# Patient Record
Sex: Female | Born: 1960 | Race: White | Hispanic: No | Marital: Married | Smoking: Never smoker
Health system: Southern US, Community
[De-identification: ages and names within clinical notes are randomized; demographics above are authoritative.]

## PROBLEM LIST (undated history)

## (undated) DIAGNOSIS — N201 Calculus of ureter: Secondary | ICD-10-CM

## (undated) DIAGNOSIS — I420 Dilated cardiomyopathy: Secondary | ICD-10-CM

## (undated) DIAGNOSIS — B009 Herpesviral infection, unspecified: Secondary | ICD-10-CM

## (undated) DIAGNOSIS — G43909 Migraine, unspecified, not intractable, without status migrainosus: Secondary | ICD-10-CM

## (undated) DIAGNOSIS — I509 Heart failure, unspecified: Secondary | ICD-10-CM

## (undated) DIAGNOSIS — T8859XA Other complications of anesthesia, initial encounter: Secondary | ICD-10-CM

## (undated) DIAGNOSIS — Z9889 Other specified postprocedural states: Secondary | ICD-10-CM

## (undated) DIAGNOSIS — T4145XA Adverse effect of unspecified anesthetic, initial encounter: Secondary | ICD-10-CM

## (undated) DIAGNOSIS — F32A Depression, unspecified: Secondary | ICD-10-CM

## (undated) DIAGNOSIS — R112 Nausea with vomiting, unspecified: Secondary | ICD-10-CM

## (undated) DIAGNOSIS — K219 Gastro-esophageal reflux disease without esophagitis: Secondary | ICD-10-CM

## (undated) DIAGNOSIS — F329 Major depressive disorder, single episode, unspecified: Secondary | ICD-10-CM

## (undated) HISTORY — DX: Migraine, unspecified, not intractable, without status migrainosus: G43.909

## (undated) HISTORY — DX: Heart failure, unspecified: I50.9

## (undated) HISTORY — DX: Major depressive disorder, single episode, unspecified: F32.9

## (undated) HISTORY — PX: ECTOPIC PREGNANCY SURGERY: SHX613

## (undated) HISTORY — PX: DILATION AND CURETTAGE OF UTERUS: SHX78

## (undated) HISTORY — PX: CARDIAC CATHETERIZATION: SHX172

## (undated) HISTORY — DX: Herpesviral infection, unspecified: B00.9

## (undated) HISTORY — PX: TUBAL LIGATION: SHX77

## (undated) HISTORY — PX: OTHER SURGICAL HISTORY: SHX169

## (undated) HISTORY — DX: Depression, unspecified: F32.A

## (undated) HISTORY — DX: Dilated cardiomyopathy: I42.0

## (undated) HISTORY — DX: Gastro-esophageal reflux disease without esophagitis: K21.9

---

## 1998-06-18 ENCOUNTER — Inpatient Hospital Stay (HOSPITAL_COMMUNITY): Admission: AD | Admit: 1998-06-18 | Discharge: 1998-06-21 | Payer: Self-pay | Admitting: *Deleted

## 1998-08-26 ENCOUNTER — Other Ambulatory Visit: Admission: RE | Admit: 1998-08-26 | Discharge: 1998-08-26 | Payer: Self-pay | Admitting: Obstetrics and Gynecology

## 2002-04-16 ENCOUNTER — Encounter: Admission: RE | Admit: 2002-04-16 | Discharge: 2002-04-16 | Payer: Self-pay | Admitting: Obstetrics and Gynecology

## 2002-04-16 ENCOUNTER — Encounter: Payer: Self-pay | Admitting: Obstetrics and Gynecology

## 2002-08-17 ENCOUNTER — Other Ambulatory Visit: Admission: RE | Admit: 2002-08-17 | Discharge: 2002-08-17 | Payer: Self-pay | Admitting: Obstetrics and Gynecology

## 2004-02-29 ENCOUNTER — Other Ambulatory Visit: Admission: RE | Admit: 2004-02-29 | Discharge: 2004-02-29 | Payer: Self-pay | Admitting: Obstetrics and Gynecology

## 2004-04-27 ENCOUNTER — Encounter (INDEPENDENT_AMBULATORY_CARE_PROVIDER_SITE_OTHER): Payer: Self-pay | Admitting: *Deleted

## 2004-04-27 ENCOUNTER — Ambulatory Visit (HOSPITAL_COMMUNITY): Admission: RE | Admit: 2004-04-27 | Discharge: 2004-04-27 | Payer: Self-pay | Admitting: Obstetrics and Gynecology

## 2008-02-24 ENCOUNTER — Encounter: Admission: RE | Admit: 2008-02-24 | Discharge: 2008-02-24 | Payer: Self-pay | Admitting: Obstetrics and Gynecology

## 2009-11-20 ENCOUNTER — Emergency Department (HOSPITAL_COMMUNITY): Admission: EM | Admit: 2009-11-20 | Discharge: 2009-11-20 | Payer: Self-pay | Admitting: Family Medicine

## 2009-11-24 ENCOUNTER — Encounter (INDEPENDENT_AMBULATORY_CARE_PROVIDER_SITE_OTHER): Payer: Self-pay | Admitting: Obstetrics and Gynecology

## 2009-11-24 ENCOUNTER — Ambulatory Visit: Admission: RE | Admit: 2009-11-24 | Discharge: 2009-11-24 | Payer: Self-pay | Admitting: Obstetrics and Gynecology

## 2009-11-25 ENCOUNTER — Inpatient Hospital Stay (HOSPITAL_COMMUNITY): Admission: EM | Admit: 2009-11-25 | Discharge: 2009-11-28 | Payer: Self-pay | Admitting: Emergency Medicine

## 2009-12-19 ENCOUNTER — Inpatient Hospital Stay (HOSPITAL_BASED_OUTPATIENT_CLINIC_OR_DEPARTMENT_OTHER): Admission: RE | Admit: 2009-12-19 | Discharge: 2009-12-19 | Payer: Self-pay | Admitting: Cardiology

## 2009-12-23 ENCOUNTER — Encounter: Payer: Self-pay | Admitting: Cardiology

## 2010-10-29 ENCOUNTER — Encounter: Payer: Self-pay | Admitting: Gastroenterology

## 2010-12-28 LAB — POCT URINALYSIS DIP (DEVICE)
Glucose, UA: NEGATIVE mg/dL
pH: 5 (ref 5.0–8.0)

## 2010-12-28 LAB — PROTEIN ELECTROPHORESIS, SERUM
Alpha-1-Globulin: 5.6 % — ABNORMAL HIGH (ref 2.9–4.9)
Alpha-2-Globulin: 14.7 % — ABNORMAL HIGH (ref 7.1–11.8)
Beta Globulin: 8.3 % — ABNORMAL HIGH (ref 4.7–7.2)
Gamma Globulin: 9.1 % — ABNORMAL LOW (ref 11.1–18.8)
M-Spike, %: NOT DETECTED g/dL

## 2010-12-28 LAB — PROTEIN ELECTROPH W RFLX QUANT IMMUNOGLOBULINS
Alpha-1-Globulin: 5.8 % — ABNORMAL HIGH (ref 2.9–4.9)
M-Spike, %: NOT DETECTED g/dL
Total Protein ELP: 5.8 g/dL — ABNORMAL LOW (ref 6.0–8.3)

## 2010-12-28 LAB — CARDIAC PANEL(CRET KIN+CKTOT+MB+TROPI)
CK, MB: 2.4 ng/mL (ref 0.3–4.0)
Relative Index: 1.6 (ref 0.0–2.5)
Total CK: 151 U/L (ref 7–177)
Troponin I: 0.04 ng/mL (ref 0.00–0.06)
Troponin I: 0.04 ng/mL (ref 0.00–0.06)

## 2010-12-28 LAB — BASIC METABOLIC PANEL
BUN: 12 mg/dL (ref 6–23)
BUN: 13 mg/dL (ref 6–23)
CO2: 30 mEq/L (ref 19–32)
Calcium: 8.4 mg/dL (ref 8.4–10.5)
Calcium: 8.7 mg/dL (ref 8.4–10.5)
Chloride: 102 mEq/L (ref 96–112)
Chloride: 103 mEq/L (ref 96–112)
Creatinine, Ser: 1.04 mg/dL (ref 0.4–1.2)
GFR calc Af Amer: >60 mL/min (ref >60)
GFR calc non Af Amer: 56 mL/min — ABNORMAL LOW (ref >60)
GFR calc non Af Amer: 57 mL/min — ABNORMAL LOW (ref >60)
GFR calc non Af Amer: 58 mL/min — ABNORMAL LOW (ref >60)
Glucose, Bld: 112 mg/dL — ABNORMAL HIGH (ref 70–99)
Glucose, Bld: 120 mg/dL — ABNORMAL HIGH (ref 70–99)
Potassium: 3.4 mEq/L — ABNORMAL LOW (ref 3.5–5.1)
Potassium: 4.1 mEq/L (ref 3.5–5.1)
Potassium: 4.3 mEq/L (ref 3.5–5.1)
Sodium: 136 mEq/L (ref 135–145)
Sodium: 140 mEq/L (ref 135–145)

## 2010-12-28 LAB — PROTIME-INR
INR: 1.25 (ref 0.00–1.49)
INR: 1.28 (ref 0.00–1.49)
Prothrombin Time: 15.6 seconds — ABNORMAL HIGH (ref 11.6–15.2)

## 2010-12-28 LAB — COMPREHENSIVE METABOLIC PANEL
ALT: 196 U/L — ABNORMAL HIGH (ref 0–35)
AST: 169 U/L — ABNORMAL HIGH (ref 0–37)
Albumin: 3.2 g/dL — ABNORMAL LOW (ref 3.5–5.2)
Alkaline Phosphatase: 170 U/L — ABNORMAL HIGH (ref 39–117)
BUN: 13 mg/dL (ref 6–23)
CO2: 24 mEq/L (ref 19–32)
Calcium: 8.7 mg/dL (ref 8.4–10.5)
Chloride: 106 mEq/L (ref 96–112)
Creatinine, Ser: 0.83 mg/dL (ref 0.4–1.2)
GFR calc Af Amer: >60 mL/min (ref >60)
GFR calc non Af Amer: >60 mL/min (ref >60)
Glucose, Bld: 105 mg/dL — ABNORMAL HIGH (ref 70–99)
Potassium: 4.3 mEq/L (ref 3.5–5.1)
Sodium: 137 mEq/L (ref 135–145)
Total Bilirubin: 0.9 mg/dL (ref 0.3–1.2)
Total Protein: 6.2 g/dL (ref 6.0–8.3)

## 2010-12-28 LAB — CBC
HCT: 34.4 % — ABNORMAL LOW (ref 36.0–46.0)
Hemoglobin: 11.9 g/dL — ABNORMAL LOW (ref 12.0–15.0)
MCHC: 34.5 g/dL (ref 30.0–36.0)
MCV: 88 fL (ref 78.0–100.0)
Platelets: 238 10*3/uL (ref 150–400)
Platelets: 258 10*3/uL (ref 150–400)
RBC: 3.91 MIL/uL (ref 3.87–5.11)
RDW: 13 % (ref 11.5–15.5)
RDW: 13 % (ref 11.5–15.5)
WBC: 12.5 10*3/uL — ABNORMAL HIGH (ref 4.0–10.5)

## 2010-12-28 LAB — BRAIN NATRIURETIC PEPTIDE
Pro B Natriuretic peptide (BNP): 1227 pg/mL — ABNORMAL HIGH (ref 0.0–100.0)
Pro B Natriuretic peptide (BNP): 461 pg/mL — ABNORMAL HIGH (ref 0.0–100.0)

## 2010-12-28 LAB — IRON AND TIBC: Saturation Ratios: 4 % — ABNORMAL LOW (ref 20–55)

## 2010-12-28 LAB — TROPONIN I: Troponin I: 0.03 ng/mL (ref 0.00–0.06)

## 2010-12-28 LAB — HIV ANTIBODY (ROUTINE TESTING W REFLEX): HIV: NONREACTIVE

## 2010-12-28 LAB — CK TOTAL AND CKMB (NOT AT ARMC)
CK, MB: 2.9 ng/mL (ref 0.3–4.0)
Relative Index: 1.6 (ref 0.0–2.5)

## 2010-12-28 LAB — APTT: aPTT: 29 seconds (ref 24–37)

## 2010-12-28 LAB — TSH: TSH: 1.301 u[IU]/mL (ref 0.350–4.500)

## 2011-02-13 ENCOUNTER — Emergency Department (HOSPITAL_COMMUNITY): Payer: BC Managed Care – PPO

## 2011-02-13 ENCOUNTER — Emergency Department (HOSPITAL_COMMUNITY)
Admission: EM | Admit: 2011-02-13 | Discharge: 2011-02-13 | Disposition: A | Discharge status: Home / Self Care | Payer: BC Managed Care – PPO | Attending: Emergency Medicine | Admitting: Emergency Medicine

## 2011-02-13 DIAGNOSIS — K219 Gastro-esophageal reflux disease without esophagitis: Secondary | ICD-10-CM | POA: Insufficient documentation

## 2011-02-13 DIAGNOSIS — R0602 Shortness of breath: Secondary | ICD-10-CM | POA: Insufficient documentation

## 2011-02-13 DIAGNOSIS — R209 Unspecified disturbances of skin sensation: Secondary | ICD-10-CM | POA: Insufficient documentation

## 2011-02-13 DIAGNOSIS — K5900 Constipation, unspecified: Secondary | ICD-10-CM | POA: Insufficient documentation

## 2011-02-13 DIAGNOSIS — R079 Chest pain, unspecified: Secondary | ICD-10-CM | POA: Insufficient documentation

## 2011-02-13 DIAGNOSIS — R0989 Other specified symptoms and signs involving the circulatory and respiratory systems: Secondary | ICD-10-CM | POA: Insufficient documentation

## 2011-02-13 DIAGNOSIS — R0609 Other forms of dyspnea: Secondary | ICD-10-CM | POA: Insufficient documentation

## 2011-02-13 DIAGNOSIS — I509 Heart failure, unspecified: Secondary | ICD-10-CM | POA: Insufficient documentation

## 2011-02-13 DIAGNOSIS — Z79899 Other long term (current) drug therapy: Secondary | ICD-10-CM | POA: Insufficient documentation

## 2011-02-13 LAB — DIFFERENTIAL
Basophils Absolute: 0 10*3/uL (ref 0.0–0.1)
Basophils Relative: 0 % (ref 0–1)
Lymphocytes Relative: 26 % (ref 12–46)
Monocytes Absolute: 0.6 10*3/uL (ref 0.1–1.0)
Monocytes Relative: 7 % (ref 3–12)
Neutro Abs: 5.2 10*3/uL (ref 1.7–7.7)
Neutrophils Relative %: 64 % (ref 43–77)

## 2011-02-13 LAB — POCT CARDIAC MARKERS
Myoglobin, poc: 57.7 ng/mL (ref 12–200)
Troponin i, poc: <0.05 ng/mL (ref 0.00–0.09)
Troponin i, poc: <0.05 ng/mL (ref 0.00–0.09)

## 2011-02-13 LAB — CBC
HCT: 38.4 % (ref 36.0–46.0)
Hemoglobin: 13.2 g/dL (ref 12.0–15.0)
RBC: 4.34 MIL/uL (ref 3.87–5.11)

## 2011-02-13 LAB — D-DIMER, QUANTITATIVE: D-Dimer, Quant: <0.22 ug/mL-FEU (ref 0.00–0.48)

## 2011-02-13 LAB — BASIC METABOLIC PANEL
Calcium: 10 mg/dL (ref 8.4–10.5)
Creatinine, Ser: 0.84 mg/dL (ref 0.4–1.2)
GFR calc Af Amer: >60 mL/min (ref >60)
GFR calc non Af Amer: >60 mL/min (ref >60)
Sodium: 138 mEq/L (ref 135–145)

## 2011-02-13 LAB — PRO B NATRIURETIC PEPTIDE: Pro B Natriuretic peptide (BNP): 55.6 pg/mL (ref 0–125)

## 2011-02-23 NOTE — Op Note (Signed)
NAME:  Krystal Bradley, Krystal Bradley                         ACCOUNT NO.:  [id number]   MEDICAL RECORD NO.:  [id number]                   PATIENT TYPE:  AMB   LOCATION:  SDC                                  FACILITY:  [facility]   PHYSICIAN:  Maxie Better, M.D.            DATE OF BIRTH:  1961/04/06   DATE OF PROCEDURE:  04/27/2004  DATE OF DISCHARGE:                                 OPERATIVE REPORT   PREOPERATIVE DIAGNOSES:  Menorrhagia, endometrial mass.   POSTOPERATIVE DIAGNOSES:  Menorrhagia, endometrial mass.   PROCEDURE:  Diagnostic hysteroscopy, dilation and curettage.   ANESTHESIA:  General with a paracervical block.   SURGEON:  Maxie Better, M.D.   DESCRIPTION OF PROCEDURE:  Under adequate general anesthesia, the patient  was placed in the dorsal lithotomy position, she was sterilely prepped and  draped in the usual fashion.  A Laminaria was removed from the cervix, the  vagina was prepped with Betadine solution. The bladder was catheterized of a  small amount of urine. Examination under anesthesia revealed an anteverted  uterus, no adnexal masses could be appreciated. A bivalve speculum was  placed in the vagina, single tooth tenaculum was placed on the anterior lip  of the cervix. The cervix is easily dilated up to a #31 Pratt dilator. A  resectoscope was then introduced into the cavity, the left tubal ostia was  obscured by the polypoid-like tissue in that left cornual area. Additional  irregularity of the anterior and the posterior endometrial cavity was noted.  No discreet defined fibroid was initially seen.  The polypoid lesions are  resected, the right tubal ostia was then subsequently seen. The resectoscope  was removed, the cavity was then curetted, the resectoscope was then  reinserted and additional thickening areas were then subsequently removed.  The endocervical canal was without any lesion, there was no visible or  palpable __________ curettage of any submucosal  fibroid. It was then felt  that the procedure was complete and therefore the resectoscope was removed.  20 mL of 1% Nesacaine was injected paracervically as the patient is allergic  to IBUPROFEN and therefore was not a candidate for Toradol. All instruments  were then removed from the vagina, specimens labeled endometrial curetting  with question endometrial polyps was sent to pathology.  Estimated blood  loss was minimal. Complications none.  The patient tolerated the procedure  well and was transferred to the recovery room in stable condition.                                               Maxie Better, M.D.    [place]uldin/MEDQ  D:  04/27/2004  T:  04/28/2004  Job:  [id number]

## 2011-03-12 IMAGING — CR DG CHEST 2V
2 series · 2 of 2 positions shown · non-contrast
Comparison: None

CLINICAL DATA: Chest pain.

CHEST - 2 VIEW

[w chest pa]
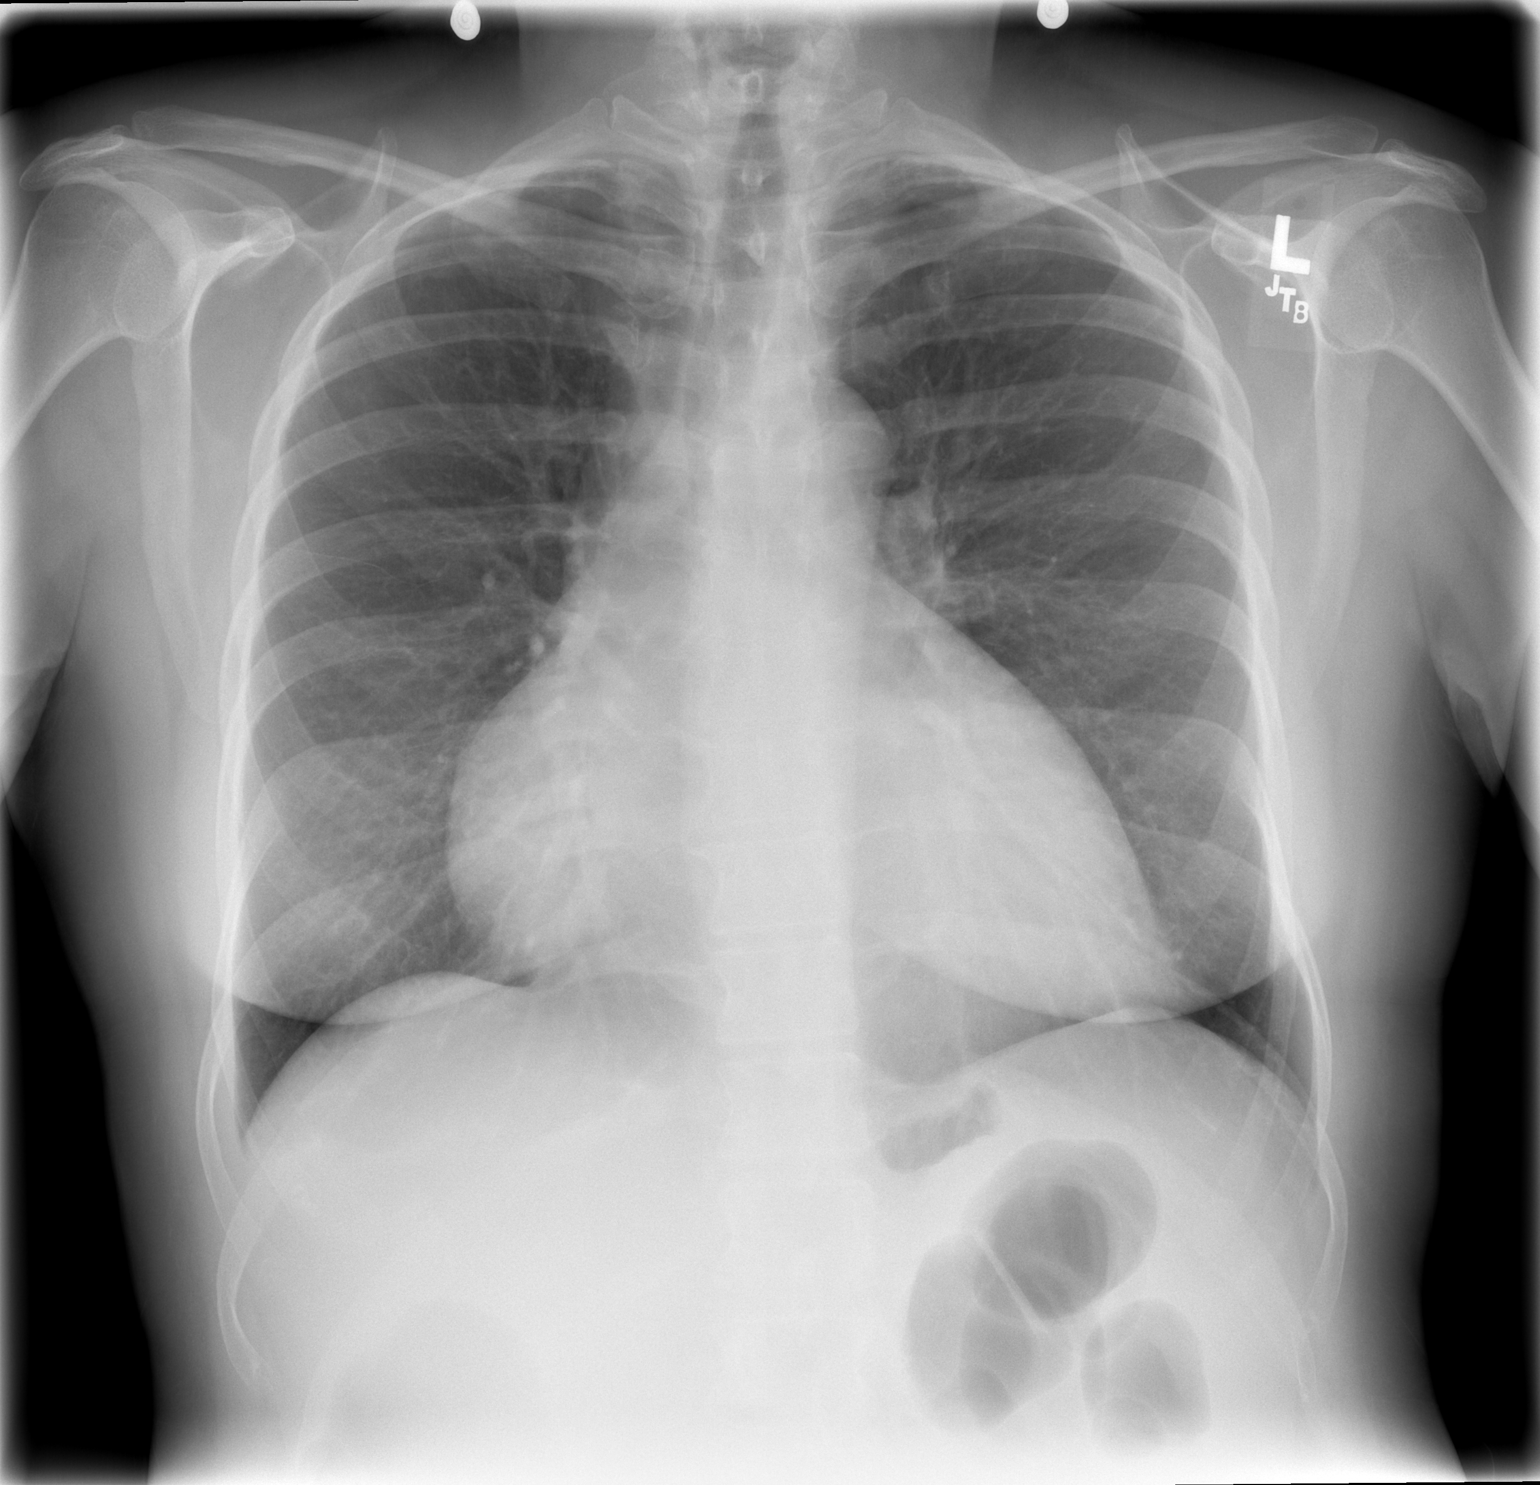

[w chest lat]
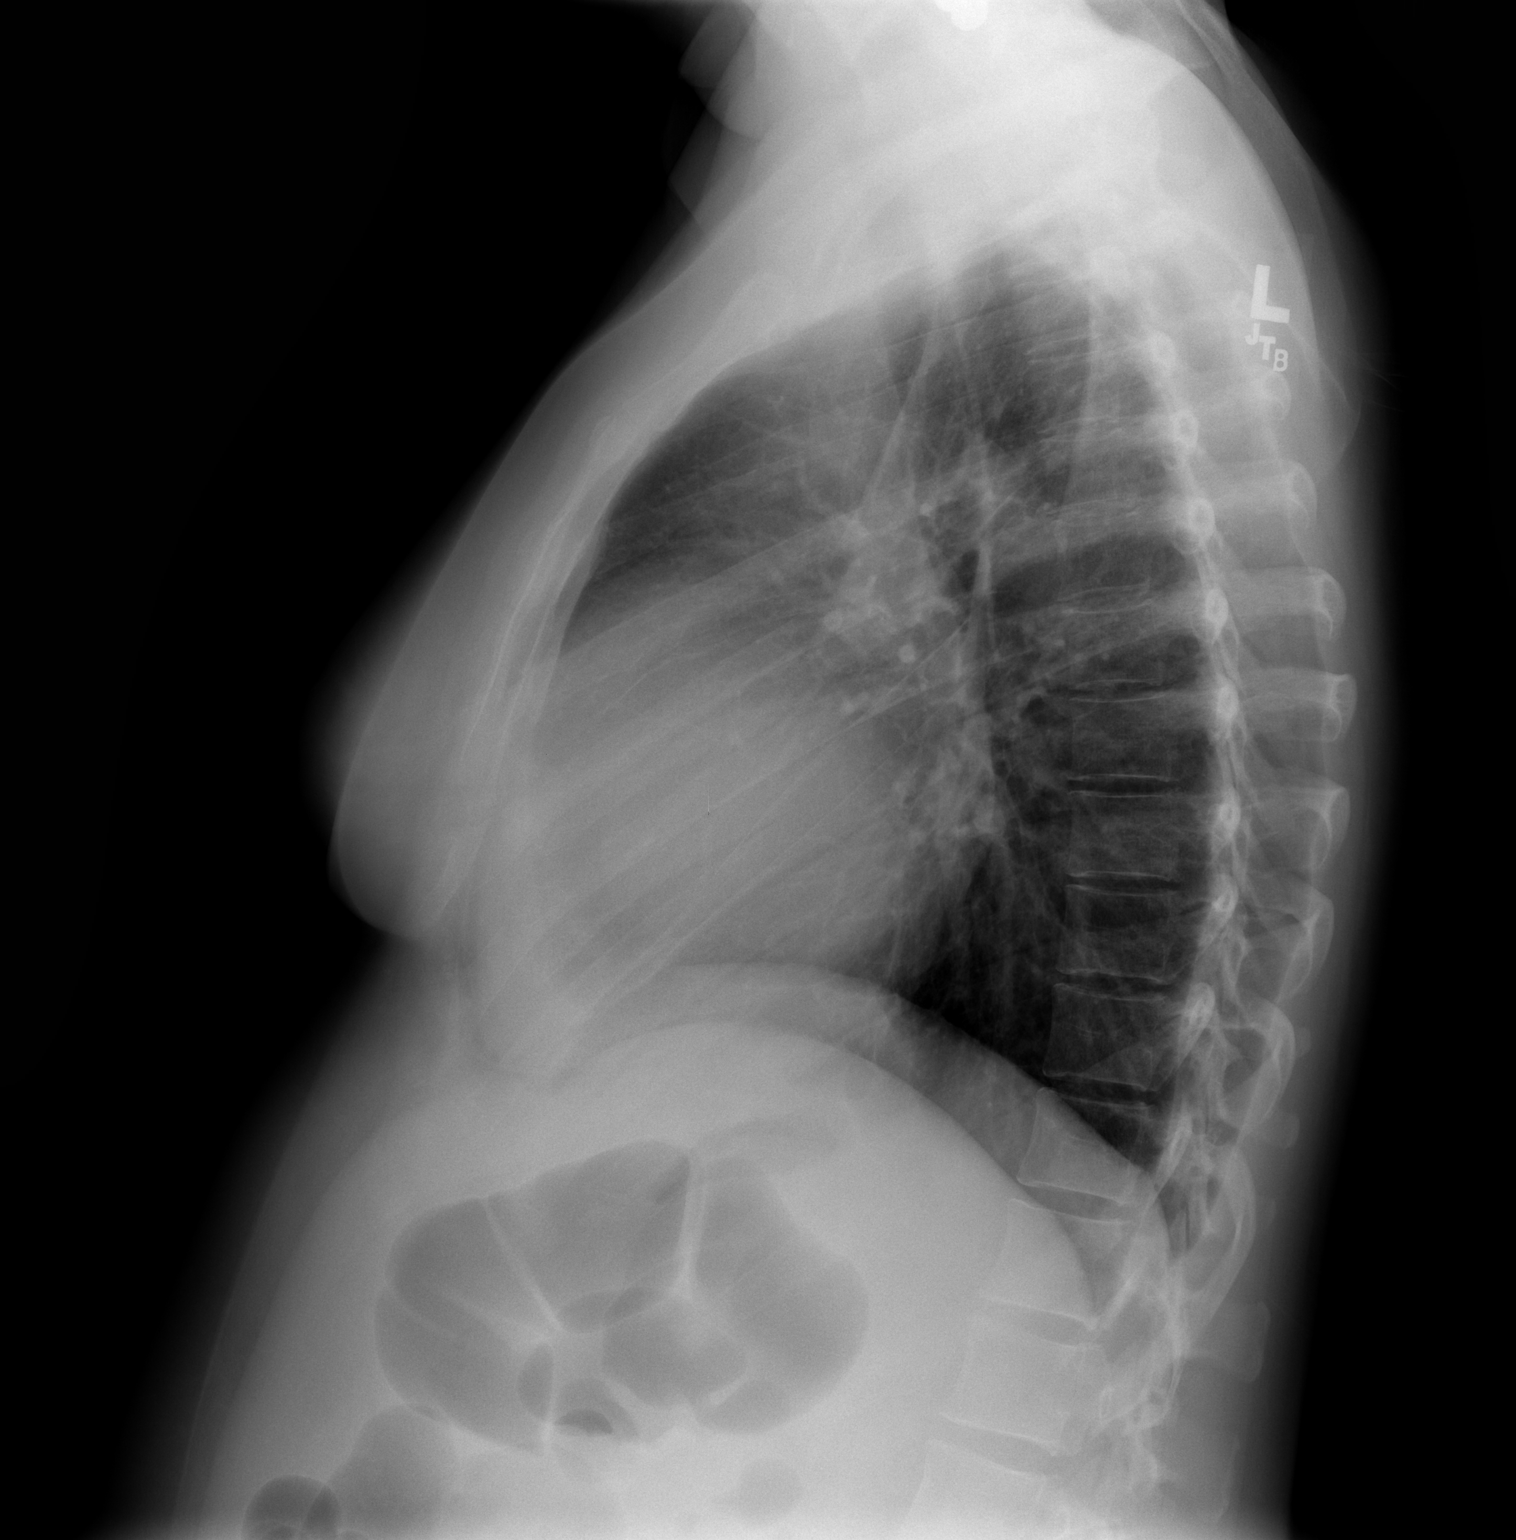

[2 of 2 positions shown; findings below may reference images not displayed]

FINDINGS: The right heart border is abnormal.  Right atrial
enlargement versus possible mediastinal mass superimposed on the
right heart border.  Chest CT suggested for further evaluation.
The lungs are clear.  The bony thorax is intact.
IMPRESSION: Cardiac enlargement versus less likely mediastinal mass.  Recommend
chest CT.

## 2011-03-12 IMAGING — CT CT CHEST W/ CM
4 of 5 series · 13 of 46 positions shown, 18 images · IV contrast (agent unspecified)
Comparison: Plain films chest 11/25/2009 at 11/21/2022

CT CHEST

CLINICAL DATA: Chest and abdominal pain.

CT CHEST, ABDOMEN AND PELVIS WITH CONTRAST
TECHNIQUE: Multidetector CT imaging of the chest, abdomen and
pelvis was performed following the standard protocol during bolus
administration of intravenous contrast.
Contrast: 100 ml [id number]

[Series 2: chest/abd/pelvis · axial · 0.73mm/px · z∈[-560,-165]mm · 7 of 120 slices shown]
[im 14/120  soft-tissue]
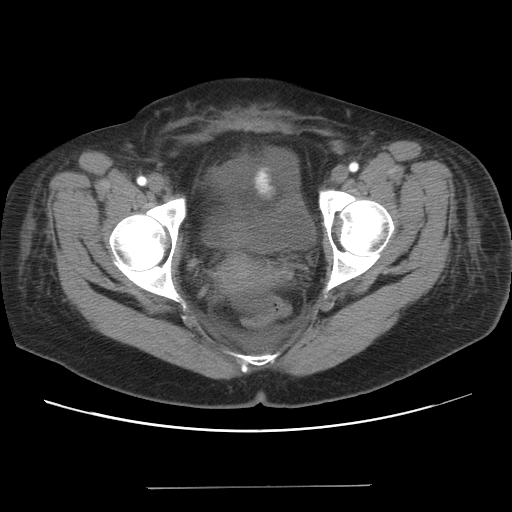
[im 27/120  soft-tissue]
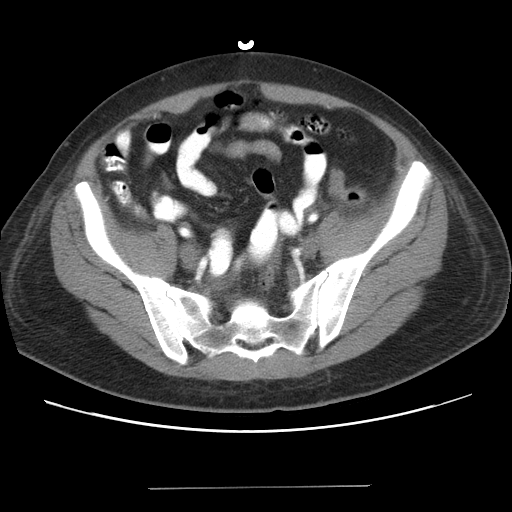
[im 40/120  soft-tissue]
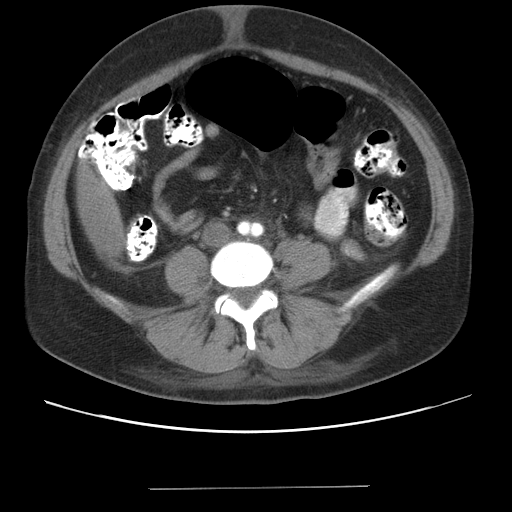
[im 53/120  soft-tissue]
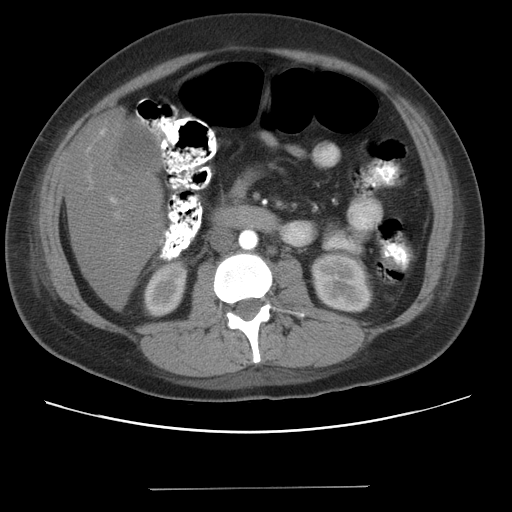
[im 67/120  soft-tissue]
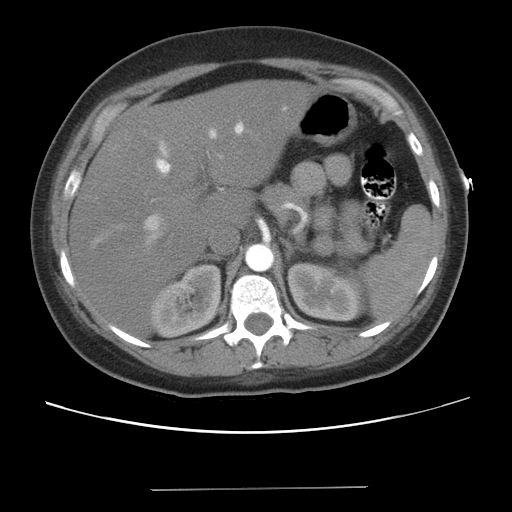
[im 80/120  soft-tissue]
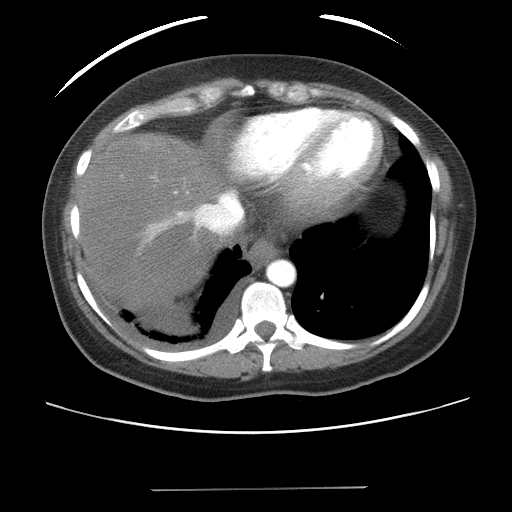
[im 93/120  soft-tissue]
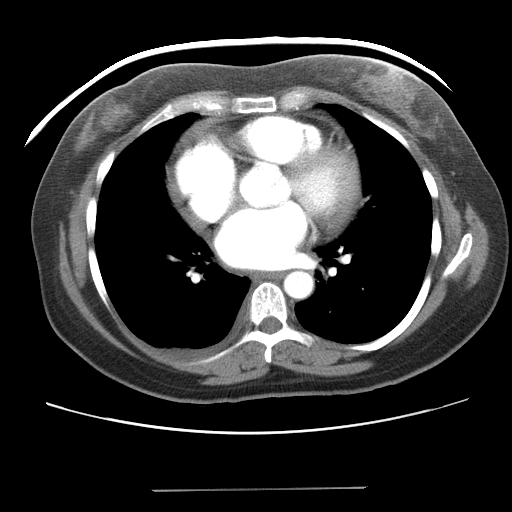

[Series 5: renal delays · axial · 0.70mm/px · z∈[-349,-309]mm · 2 of 25 slices shown, 5 images]
[im 9/25  soft-tissue]
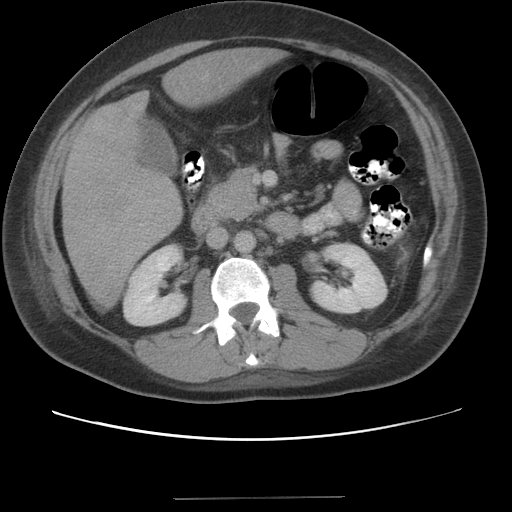
[im 9/25  lung]
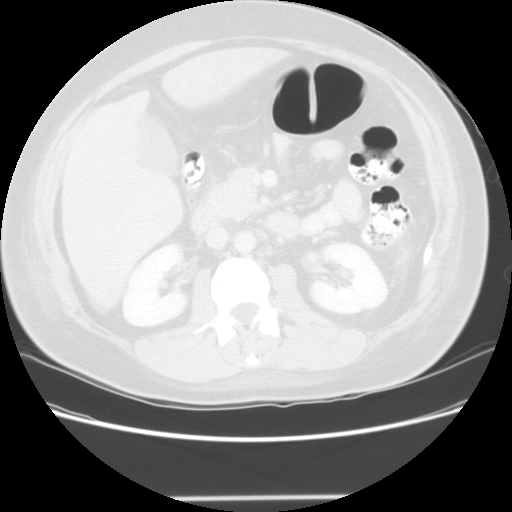
[im 9/25  bone]
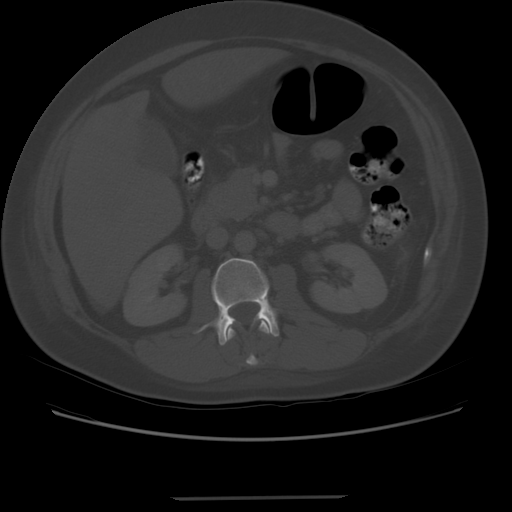
[im 17/25  soft-tissue]
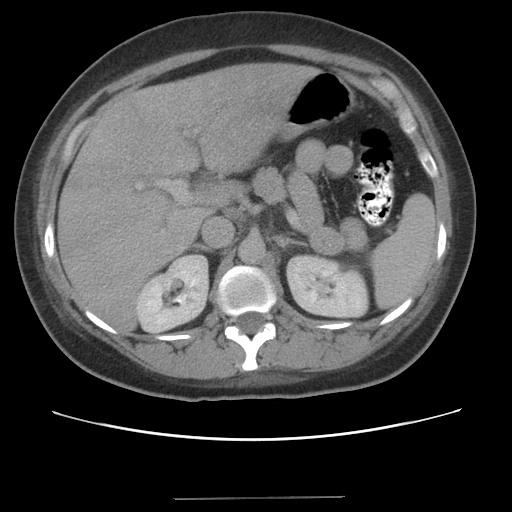
[im 17/25  lung]
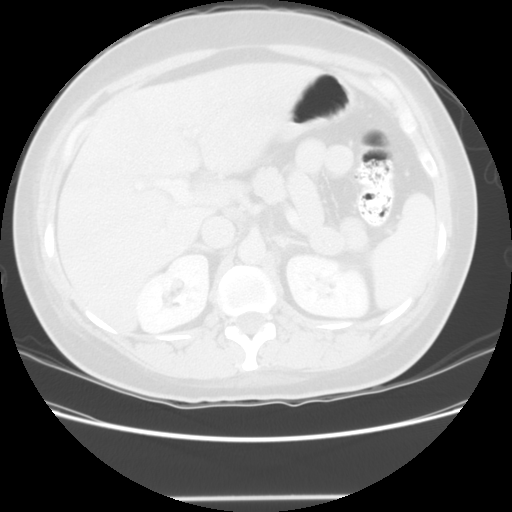

[Series 400: reformatted · sagittal · 1.23mm/px · 1 of 130 slices shown, 2 images (1 of 2)]
[im 44/130  soft-tissue]
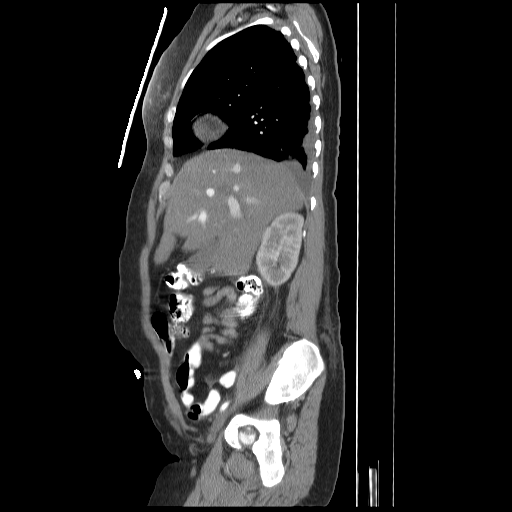
[im 44/130  bone]
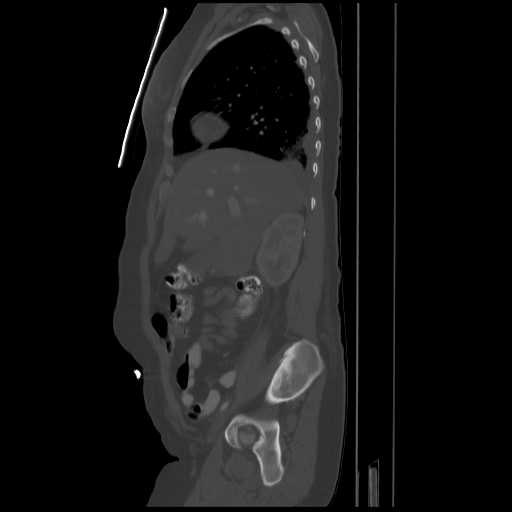

[Series 401: reformatted · coronal · 1.23mm/px · 3 of 109 slices shown, 4 images (2 of 2)]
[im 37/109  soft-tissue]
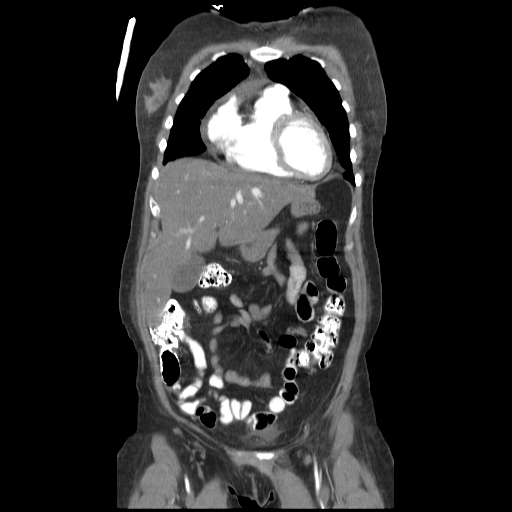
[im 49/109  soft-tissue]
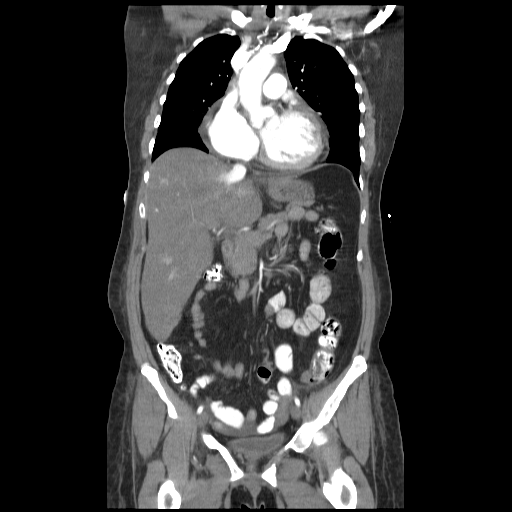
[im 49/109  bone]
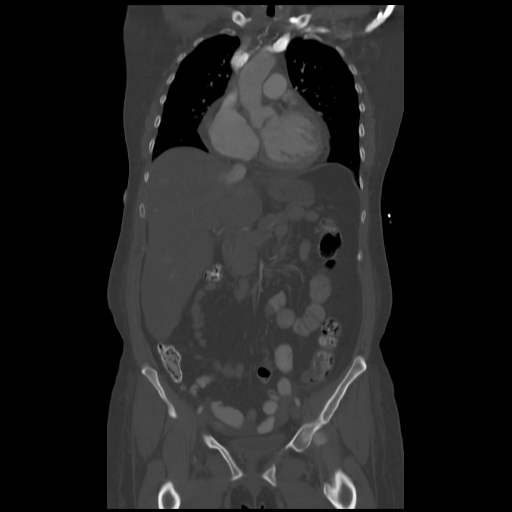
[im 61/109  soft-tissue]
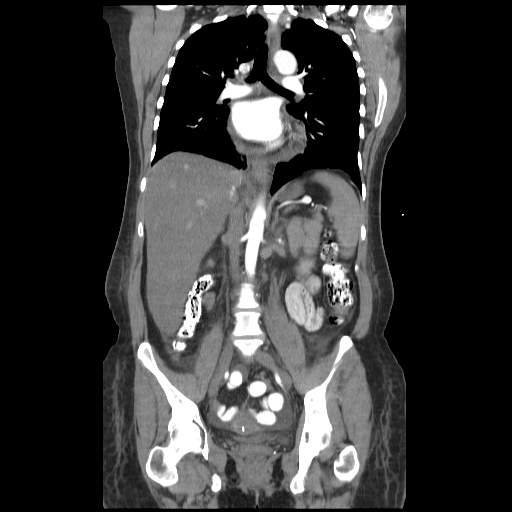

[13 of 46 positions shown; findings below may reference images not displayed]

FINDINGS: There is no axillary, hilar mediastinal lymphadenopathy.
The patient has marked cardiomegaly and a small to moderate
pericardial effusion.  There is also a small right pleural
effusion.  Tiny amount of pleural fluid on the left is noted.
Contrast material refluxes into the inferior vena cava.  No
axillary, hilar or mediastinal lymphadenopathy.  Patchy airspace
disease is noted the right lower lobe.  A subcentimeter pulmonary
nodules seen in the left base on image 37 measuring 0.5 cm.  The
lungs are otherwise clear.  No focal bony abnormality.
IMPRESSION: 1.  Marked cardiomegaly.  Contrast material refluxing into the
inferior vena cava is compatible with right heart insufficiency.
3.  Small to moderate pericardial effusion.
3.  Small right pleural effusion.
4.  Right basilar airspace disease could be due to atelectasis or
pneumonia.
5.  0.5 cm left lower lobe pulmonary nodule. If the patient is at
high risk for bronchogenic carcinoma, follow-up chest CT at 6-12
months is recommended.  If the patient is at low risk for
bronchogenic carcinoma, follow-up chest CT at 12 months is
recommended.  This recommendation follows the consensus statement:
"Guidelines for Management of Small Pulmonary Nodules Detected on
CT Scans: A Statement from the [HOSPITAL]" as published in
[URL]

CT ABDOMEN AND PELVIS
FINDINGS: There is some increased attenuation in the gallbladder
likely due to sludge.  The liver is diffusely low attenuating
consistent with fatty change but there is no focal liver lesion.
No intra or extra hepatic biliary ductal dilatation.  The spleen,
adrenal glands and kidneys appear normal.  The pancreas is
unremarkable.

The stomach and small bowel appear normal.  There is a small volume
of pelvic ascites with os with some fluid also seen in the
paracolic gutters bilaterally.  There is also some subcutaneous
edema diffusely.  No focal bony abnormality.
IMPRESSION: 1.  Findings compatible with mild anasarca.
2.  Fatty infiltration of the liver.
3.  Likely gallbladder sludge.

## 2011-08-14 ENCOUNTER — Encounter (HOSPITAL_COMMUNITY): Payer: Self-pay | Admitting: Internal Medicine

## 2011-08-14 ENCOUNTER — Encounter: Payer: Self-pay | Admitting: *Deleted

## 2011-09-04 ENCOUNTER — Encounter (HOSPITAL_COMMUNITY): Payer: BC Managed Care – PPO

## 2011-09-06 ENCOUNTER — Telehealth (HOSPITAL_COMMUNITY): Payer: Self-pay | Admitting: *Deleted

## 2011-09-10 NOTE — Telephone Encounter (Signed)
Opened in error

## 2011-09-14 ENCOUNTER — Encounter (HOSPITAL_COMMUNITY): Payer: BC Managed Care – PPO

## 2012-01-29 ENCOUNTER — Encounter: Payer: Self-pay | Admitting: *Deleted

## 2012-01-30 ENCOUNTER — Encounter: Payer: Self-pay | Admitting: Cardiology

## 2012-01-30 ENCOUNTER — Ambulatory Visit (INDEPENDENT_AMBULATORY_CARE_PROVIDER_SITE_OTHER): Payer: BC Managed Care – PPO | Admitting: Cardiology

## 2012-01-30 DIAGNOSIS — I509 Heart failure, unspecified: Secondary | ICD-10-CM

## 2012-01-30 DIAGNOSIS — I429 Cardiomyopathy, unspecified: Secondary | ICD-10-CM | POA: Insufficient documentation

## 2012-01-30 DIAGNOSIS — I428 Other cardiomyopathies: Secondary | ICD-10-CM

## 2012-01-30 NOTE — Assessment & Plan Note (Signed)
Euvolemic on examination. Continue present dose of diuretics. Recent potassium and renal function normal.

## 2012-01-30 NOTE — Assessment & Plan Note (Addendum)
Patient with previous cardiomyopathy of uncertain etiology. Cardiac catheterization revealed no coronary disease. No history of alcohol use or hypertension. Presumed viral. She is doing extremely well on medications. Her LV function is improving on her last echocardiogram. Will repeat to reassess. Continue ACE inhibitor and beta blocker.

## 2012-01-30 NOTE — Progress Notes (Signed)
HPI: 51 year-old female for evaluation of cardiomyopathy and congestive heart failure. Echocardiogram in February of 2011 showed an ejection fraction of 20-25%, mild biatrial enlargement, mild mitral regurgitation and moderate tricuspid regurgitation. There is a small pericardial effusion. Cardiac catheterization in 2011 showed an ejection fraction of 15% and normal coronary arteries. Patient was treated medically. She was seen at [facility] for treatment of her heart failure. Apparently a repeat echocardiogram in February of 2012 showed improvement in her LV function with an ejection fraction of 45%. The patient now presents to establish in [place]. She is markedly improved on medications. She denies any dyspnea on exertion, orthopnea, PND, pedal edema, palpitations, syncope or chest pain. She had blood work checked on April 22 that showed normal renal function and a potassium of 4.1.  Current Outpatient Prescriptions  Medication Sig Dispense Refill  . ALPRAZolam (XANAX) 1 MG tablet Take 1 mg by mouth daily.        . butalbital-acetaminophen-caffeine (FIORICET WITH CODEINE) 50-325-40-30 MG per capsule Take 1 capsule by mouth every 4 (four) hours as needed.      . carvedilol (COREG) 6.25 MG tablet Take 6.25 mg by mouth 2 (two) times daily with a meal.        . FLUoxetine (PROZAC) 20 MG capsule Take 20 mg by mouth daily.        . furosemide (LASIX) 40 MG tablet Take 1 tablet by mouth Daily.      . methocarbamol (ROBAXIN) 500 MG tablet Take 500 mg by mouth as needed.      . nortriptyline (PAMELOR) 25 MG capsule Take 25 mg by mouth at bedtime.        . ramipril (ALTACE) 5 MG capsule Take 5 mg by mouth daily.        . ranitidine (ZANTAC) 150 MG tablet Take 150 mg by mouth as needed.        Marland Kitchen spironolactone (ALDACTONE) 25 MG tablet Take 25 mg by mouth daily.        . valACYclovir (VALTREX) 500 MG tablet Take 500 mg by mouth daily.        Marland Kitchen zonisamide (ZONEGRAN) 25 MG capsule Take 50 mg by mouth  daily.          Allergies  Allergen Reactions  . Ibuprofen Hives  . Penicillins Hives  . Cephalosporins Rash    Past Medical History  Diagnosis Date  . 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 Migraines   . Depression   . CHF (congestive heart failure)   . GERD (gastroesophageal reflux disease)   . Herpes     Past Surgical History  Procedure Date  . Cesarean section   . Ectopic pregnancy surgery   . Dilation and curettage of uterus   . Tonsillectomy     History   Social History  . Marital Status: Married    Spouse Name: N/A    Number of Children: 2  . Years of Education: N/A   Occupational History  .  [employer]   Social History Main Topics  . Smoking status: Never Smoker   . Smokeless tobacco: Not on file  . Alcohol Use: 0.0 oz/week     Occasional  . Drug Use: No  . Sexually Active: Not on file   Other Topics Concern  . Not on file   Social History Narrative   She is married and has 2 children.  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    Family History  Problem Relation Age of Onset  . Cancer Mother   . Cancer Father   . Heart disease Father     Atrial fibrillation    ROS: no fevers or chills, productive cough, hemoptysis, dysphasia, odynophagia, melena, hematochezia, dysuria, hematuria, rash, seizure activity, orthopnea, PND, pedal edema, claudication. Remaining systems are negative.  Physical Exam:   Blood pressure 119/72, pulse 83, height 5\' 4"  (1.626 m), weight 61.689 kg (136 lb).  General:  Well developed/well nourished in NAD Skin warm/dry Patient not depressed No peripheral clubbing Back-normal HEENT-normal/normal eyelids Neck supple/normal carotid upstroke bilaterally; no bruits; no JVD; no thyromegaly chest - CTA/ normal expansion CV - RRR/normal S1 and S2; no murmurs, rubs or gallops;  PMI nondisplaced Abdomen -NT/ND, no HSM, no mass, + bowel sounds, no  bruit 2+ femoral pulses, no bruits Ext-no edema, chords, 2+ DP Neuro-grossly nonfocal  ECG sinus rhythm at a rate of 83. Axis normal. Nonspecific T-wave changes. Prolonged QT.

## 2012-01-30 NOTE — Patient Instructions (Signed)

## 2012-02-07 ENCOUNTER — Other Ambulatory Visit: Payer: Self-pay

## 2012-02-07 ENCOUNTER — Ambulatory Visit (HOSPITAL_COMMUNITY): Payer: BC Managed Care – PPO | Attending: Cardiology

## 2012-02-07 DIAGNOSIS — I428 Other cardiomyopathies: Secondary | ICD-10-CM

## 2012-02-12 ENCOUNTER — Telehealth: Payer: Self-pay | Admitting: *Deleted

## 2012-02-12 NOTE — Telephone Encounter (Signed)
Message copied by Freddi Starr on Tue Feb 12, 2012  2:23 PM ------      Message from: Lewayne Bunting      Created: Fri Feb 08, 2012  2:07 PM       I have reviewed echo and EF appears closer to 40; schedule MUGA to better quantitate.      Olga Millers

## 2012-02-12 NOTE — Telephone Encounter (Signed)
Pt to call back to schedule MUGA.

## 2012-02-13 ENCOUNTER — Telehealth: Payer: Self-pay | Admitting: Cardiology

## 2012-02-13 NOTE — Telephone Encounter (Signed)
Pt calling re question re appt debra told her about yesterday

## 2012-02-13 NOTE — Telephone Encounter (Signed)
Spoke with pt, muga testing scheduled.

## 2012-02-19 ENCOUNTER — Ambulatory Visit (HOSPITAL_COMMUNITY): Payer: BC Managed Care – PPO | Attending: Cardiology | Admitting: Radiology

## 2012-02-19 DIAGNOSIS — I5022 Chronic systolic (congestive) heart failure: Secondary | ICD-10-CM

## 2012-02-19 DIAGNOSIS — I428 Other cardiomyopathies: Secondary | ICD-10-CM | POA: Insufficient documentation

## 2012-02-19 NOTE — Progress Notes (Signed)
Muga Study  Referring Provider:  Lewayne Bunting, MD  Date of Procedure: 02/19/2012  Indication: '11 Catheterization:EF=15%,normal coronaries;History of Cardiomyopathy and CHF and 02/07/12 Echo:EF=40% and mild LVH IV started via Right Hand with 20 gauge insyte;patient tolerated well by Darrick Penna Muga Information:  The patient's red blood cells were labeled using the Ultra Tag method with 33.0 mci of Technetium 65m Pertechnetate.  The images were reconstructed in the Anterior, Lateral and Left Anterior oblique views.  Impression: EF 50%.  Mild global hypokinesis. Good quality study.   Azelie Noguera [employer]

## 2012-02-20 ENCOUNTER — Telehealth: Payer: Self-pay | Admitting: Cardiology

## 2012-02-20 NOTE — Telephone Encounter (Signed)
New Problem: ° ° ° °Patient returned your call.  Please call back. °

## 2012-02-20 NOTE — Telephone Encounter (Signed)
Spoke with pt, aware of muga results

## 2012-02-27 ENCOUNTER — Encounter: Payer: Self-pay | Admitting: Cardiology

## 2012-04-28 ENCOUNTER — Telehealth: Payer: Self-pay | Admitting: Cardiology

## 2012-04-28 MED ORDER — SPIRONOLACTONE 25 MG PO TABS: 25.0000 mg | ORAL_TABLET | Freq: Every day | ORAL | Status: DC

## 2012-04-28 MED ORDER — CARVEDILOL 6.25 MG PO TABS: 6.2500 mg | ORAL_TABLET | Freq: Two times a day (BID) | ORAL | Status: DC

## 2012-04-28 MED ORDER — FUROSEMIDE 40 MG PO TABS: 40.0000 mg | ORAL_TABLET | Freq: Every day | ORAL | Status: DC

## 2012-04-28 MED ORDER — RAMIPRIL 5 MG PO CAPS: 5.0000 mg | ORAL_CAPSULE | Freq: Every day | ORAL | Status: DC

## 2012-04-28 NOTE — Telephone Encounter (Signed)
Pt calling re question on meds pls call [phone] ext [phone]

## 2012-04-28 NOTE — Telephone Encounter (Signed)
She needed her medications called in to Pharmacy

## 2013-01-09 ENCOUNTER — Encounter: Payer: Self-pay | Admitting: Cardiology

## 2013-01-09 ENCOUNTER — Ambulatory Visit (INDEPENDENT_AMBULATORY_CARE_PROVIDER_SITE_OTHER): Payer: BC Managed Care – PPO | Admitting: Cardiology

## 2013-01-09 VITALS — BP 89/59 | HR 77 | Ht 64.0 in | Wt 132.0 lb

## 2013-01-09 DIAGNOSIS — I428 Other cardiomyopathies: Secondary | ICD-10-CM

## 2013-01-09 DIAGNOSIS — I429 Cardiomyopathy, unspecified: Secondary | ICD-10-CM

## 2013-01-09 DIAGNOSIS — I509 Heart failure, unspecified: Secondary | ICD-10-CM

## 2013-01-09 MED ORDER — FUROSEMIDE 20 MG PO TABS: 20.0000 mg | ORAL_TABLET | Freq: Every day | ORAL | Status: DC

## 2013-01-09 NOTE — Patient Instructions (Addendum)
Your physician wants you to follow-up in: ONE YEAR WITH DR Shelda Pal will receive a reminder letter in the mail two months in advance. If you don't receive a letter, please call our office to schedule the follow-up appointment.   DECREASE FUROSEMIDE TO 20 MG ONCE DAILY  Your physician recommends that you return for lab work in: ONE WEEK  Your physician has requested that you have an echocardiogram. Echocardiography is a painless test that uses sound waves to create images of your heart. It provides your doctor with information about the size and shape of your heart and how well your heart's chambers and valves are working. This procedure takes approximately one hour. There are no restrictions for this procedure.    STOP SPRIONOLACTONE

## 2013-01-09 NOTE — Progress Notes (Signed)
HPI: Pleasant female for fu of cardiomyopathy and congestive heart failure. Echocardiogram in February of 2011 showed an ejection fraction of 20-25%, mild biatrial enlargement, mild mitral regurgitation and moderate tricuspid regurgitation. There is a small pericardial effusion. Cardiac catheterization in 2011 showed an ejection fraction of 15% and normal coronary arteries. Patient was treated medically. She was seen at [facility] for treatment of her heart failure and fu echo showed some improvement of LV function. Last echocardiogram in May 2013 showed an ejection fraction of 30-35% with diffuse hypokinesis and grade 1 diastolic dysfunction. Followup MUGA in may of 2013 showed an ejection fraction of 50%. I last saw her in April of 2013. Since then, the patient denies any dyspnea on exertion, orthopnea, PND, pedal edema, palpitations, syncope or chest pain.    Current Outpatient Prescriptions  Medication Sig Dispense Refill  . ALPRAZolam (XANAX) 1 MG tablet Take 1 mg by mouth daily.        . butalbital-acetaminophen-caffeine (FIORICET WITH CODEINE) 50-325-40-30 MG per capsule Take 1 capsule by mouth every 4 (four) hours as needed.      . carvedilol (COREG) 6.25 MG tablet Take 1 tablet (6.25 mg total) by mouth 2 (two) times daily with a meal.  180 tablet  3  . FLUoxetine (PROZAC) 20 MG capsule Take 20 mg by mouth daily.        . furosemide (LASIX) 40 MG tablet Take 1 tablet (40 mg total) by mouth daily.  90 tablet  3  . methocarbamol (ROBAXIN) 500 MG tablet Take 500 mg by mouth as needed.      . nortriptyline (PAMELOR) 25 MG capsule Take 25 mg by mouth at bedtime.        . ramipril (ALTACE) 5 MG capsule Take 1 capsule (5 mg total) by mouth daily.  90 capsule  3  . ranitidine (ZANTAC) 150 MG tablet Take 150 mg by mouth as needed.        Marland Kitchen spironolactone (ALDACTONE) 25 MG tablet Take 1 tablet (25 mg total) by mouth daily.  90 tablet  3  . valACYclovir (VALTREX) 500 MG tablet Take 500 mg by  mouth daily.        Marland Kitchen zonisamide (ZONEGRAN) 25 MG capsule Take 50 mg by mouth daily.         No current facility-administered medications for this visit.     Past Medical History  Diagnosis Date  . 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 Migraines   . Depression   . CHF (congestive heart failure)   . GERD (gastroesophageal reflux disease)   . Herpes     Past Surgical History  Procedure Laterality Date  . Cesarean section    . Ectopic pregnancy surgery    . Dilation and curettage of uterus    . Tonsillectomy      History   Social History  . Marital Status: Married    Spouse Name: N/A    Number of Children: 2  . Years of Education: N/A   Occupational History  .  [employer]   Social History Main Topics  . Smoking status: Never Smoker   . Smokeless tobacco: Not on file  . Alcohol Use: 0.0 oz/week     Comment: Occasional  . Drug Use: No  . Sexually Active: Not on file   Other Topics Concern  . Not on file   Social History Narrative   She  is married and has 2 children.  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    ROS: no fevers or chills, productive cough, hemoptysis, dysphasia, odynophagia, melena, hematochezia, dysuria, hematuria, rash, seizure activity, orthopnea, PND, pedal edema, claudication. Remaining systems are negative.  Physical Exam: Well-developed well-nourished in no acute distress.  Skin is warm and dry.  HEENT is normal.  Neck is supple.  Chest is clear to auscultation with normal expansion.  Cardiovascular exam is regular rate and rhythm.  Abdominal exam nontender or distended. No masses palpated. Extremities show no edema. neuro grossly intact  ECG sinus rhythm at a rate of 77. Prolonged QT interval. Nonspecific ST changes.

## 2013-01-09 NOTE — Assessment & Plan Note (Signed)
Plan continue ACE inhibitor and beta blocker. Repeat echocardiogram in may.

## 2013-01-09 NOTE — Assessment & Plan Note (Signed)
Patient has had no CHF symptoms. She is euvolemic on examination. I will discontinue her spironolactone and decrease her Lasix to 20 mg daily. If she develops increasing dyspnea, pedal edema or her weight increases by 2-3 pounds she will resume her previous diuretic regimen. Check potassium and renal function in one week.

## 2013-02-02 ENCOUNTER — Telehealth: Payer: Self-pay | Admitting: Cardiology

## 2013-02-02 NOTE — Telephone Encounter (Signed)
New problem    Pt having vibration sensation in her body and wants to ask a question about that

## 2013-02-02 NOTE — Telephone Encounter (Signed)
Follow up ° ° °Please call pt. °

## 2013-02-02 NOTE — Telephone Encounter (Signed)
Spoke with pt, Aware of dr crenshaw's recommendations.  °

## 2013-02-02 NOTE — Telephone Encounter (Signed)
Spoke with pt, she reports starting about 5 days ago a vibrating feeling under her body. When she stands it feels like it is under her feet and when sitting under her bottom. She denies SOB, chest pain or palpitations. Explained to pt not sure what that could be. She wants me to forward the information to dr [name] to get his option. She does not have a primary care. She is taking no new medicine. Will forward for dr [name] review

## 2013-02-02 NOTE — Telephone Encounter (Signed)
Doubt cardiac related; fu with her primary care Olga Millers

## 2013-02-11 ENCOUNTER — Encounter: Payer: Self-pay | Admitting: Cardiology

## 2013-03-03 ENCOUNTER — Ambulatory Visit (HOSPITAL_COMMUNITY): Payer: BC Managed Care – PPO | Attending: Cardiology

## 2013-03-03 DIAGNOSIS — I428 Other cardiomyopathies: Secondary | ICD-10-CM | POA: Insufficient documentation

## 2013-03-03 DIAGNOSIS — I079 Rheumatic tricuspid valve disease, unspecified: Secondary | ICD-10-CM | POA: Insufficient documentation

## 2013-03-03 DIAGNOSIS — I509 Heart failure, unspecified: Secondary | ICD-10-CM | POA: Insufficient documentation

## 2013-03-03 DIAGNOSIS — I429 Cardiomyopathy, unspecified: Secondary | ICD-10-CM

## 2013-03-03 DIAGNOSIS — I059 Rheumatic mitral valve disease, unspecified: Secondary | ICD-10-CM | POA: Insufficient documentation

## 2013-03-03 NOTE — Progress Notes (Signed)
Echocardiogram performed.  

## 2013-05-29 ENCOUNTER — Other Ambulatory Visit: Payer: Self-pay | Admitting: Cardiology

## 2013-06-24 ENCOUNTER — Other Ambulatory Visit: Payer: Self-pay | Admitting: Cardiology

## 2013-07-08 ENCOUNTER — Encounter: Payer: Self-pay | Admitting: Cardiology

## 2013-08-21 ENCOUNTER — Telehealth: Payer: Self-pay | Admitting: Cardiology

## 2013-08-21 NOTE — Telephone Encounter (Signed)
Spoke with pt, her neurologist wants to make sure it would be okay for her to take Ansaids for her headaches. Will forward for dr [name] review

## 2013-08-21 NOTE — Telephone Encounter (Signed)
Ok  Krystal Bradley  

## 2013-08-21 NOTE — Telephone Encounter (Signed)
New Problem]  Pt called- states that her Neurologist has her on a new medication for headaches and she requests a call back to determine if it will coincide with her current medications.. Please call.

## 2013-08-25 NOTE — Telephone Encounter (Signed)
Left message for pt, Aware of dr crenshaw's recommendations.  

## 2013-08-26 ENCOUNTER — Other Ambulatory Visit: Payer: Self-pay | Admitting: Cardiology

## 2013-09-28 ENCOUNTER — Other Ambulatory Visit: Payer: Self-pay | Admitting: Cardiology

## 2013-12-30 ENCOUNTER — Other Ambulatory Visit: Payer: Self-pay | Admitting: Cardiology

## 2014-02-02 ENCOUNTER — Ambulatory Visit (INDEPENDENT_AMBULATORY_CARE_PROVIDER_SITE_OTHER): Payer: BC Managed Care – PPO | Admitting: Cardiology

## 2014-02-02 ENCOUNTER — Encounter: Payer: Self-pay | Admitting: Cardiology

## 2014-02-02 VITALS — BP 104/64 | HR 85 | Ht 64.0 in | Wt 134.0 lb

## 2014-02-02 DIAGNOSIS — I429 Cardiomyopathy, unspecified: Secondary | ICD-10-CM

## 2014-02-02 DIAGNOSIS — I509 Heart failure, unspecified: Secondary | ICD-10-CM

## 2014-02-02 DIAGNOSIS — I428 Other cardiomyopathies: Secondary | ICD-10-CM

## 2014-02-02 MED ORDER — FUROSEMIDE 20 MG PO TABS: 20.0000 mg | ORAL_TABLET | Freq: Every day | ORAL | Status: DC | PRN

## 2014-02-02 NOTE — Progress Notes (Signed)
HPI: FU cardiomyopathy and congestive heart failure. Echocardiogram in February of 2011 showed an ejection fraction of 20-25%, mild biatrial enlargement, mild mitral regurgitation and moderate tricuspid regurgitation. There is a small pericardial effusion. Cardiac catheterization in 2011 showed an ejection fraction of 15% and normal coronary arteries. Patient was treated medically. [facility] heart failure and fu echo showed some improvement of LV function. Last echocardiogram in May 2014 showed an ejection fraction of 50-55% and grade 1 diastolic dysfunction, mild MR. I last saw her in April of 2014. Since then, the patient denies any dyspnea on exertion, orthopnea, PND, pedal edema, palpitations, syncope or chest pain.   Current Outpatient Prescriptions  Medication Sig Dispense Refill  . ALPRAZolam (XANAX) 1 MG tablet Take 1 mg by mouth daily.        . butalbital-acetaminophen-caffeine (FIORICET WITH CODEINE) 50-325-40-30 MG per capsule Take 1 capsule by mouth every 4 (four) hours as needed.      . carvedilol (COREG) 6.25 MG tablet TAKE 1 TABLET BY MOUTH TWICE DAILY WITH A MEAL  180 tablet  0  . FLUoxetine (PROZAC) 20 MG capsule Take 20 mg by mouth daily.        . furosemide (LASIX) 20 MG tablet Take 1 tablet (20 mg total) by mouth daily.  90 tablet  3  . HYDROcodone-acetaminophen (NORCO/VICODIN) 5-325 MG per tablet       . nortriptyline (PAMELOR) 25 MG capsule Take 25 mg by mouth at bedtime.        . ramipril (ALTACE) 5 MG capsule TAKE ONE CAPSULE BY MOUTH DAILY  90 capsule  1  . ranitidine (ZANTAC) 150 MG tablet Take 150 mg by mouth as needed.        . valACYclovir (VALTREX) 500 MG tablet Take 500 mg by mouth daily.        Marland Kitchen. zonisamide (ZONEGRAN) 25 MG capsule Take 50 mg by mouth daily.         No current facility-administered medications for this visit.     Past Medical History  Diagnosis Date  . 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 Migraines   . Depression   . CHF (congestive heart failure)   . GERD (gastroesophageal reflux disease)   . Herpes     Past Surgical History  Procedure Laterality Date  . Cesarean section    . Ectopic pregnancy surgery    . Dilation and curettage of uterus    . Tonsillectomy      History   Social History  . Marital Status: Married    Spouse Name: N/A    Number of Children: 2  . Years of Education: N/A   Occupational History  .  [employer]   Social History Main Topics  . Smoking status: Never Smoker   . Smokeless tobacco: Not on file  . Alcohol Use: 0.0 oz/week     Comment: Occasional  . Drug Use: No  . Sexual Activity: Not on file   Other Topics Concern  . Not on file   Social History Narrative   She is married and has 2 children.  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    ROS: no fevers or chills, productive cough, hemoptysis, dysphasia, odynophagia, melena, hematochezia, dysuria, hematuria, rash, seizure activity, orthopnea, PND, pedal edema, claudication. Remaining systems are negative.  Physical Exam: Well-developed well-nourished  in no acute distress.  Skin is warm and dry.  HEENT is normal.  Neck is supple.  Chest is clear to auscultation with normal expansion.  Cardiovascular exam is regular rate and rhythm.  Abdominal exam nontender or distended. No masses palpated. Extremities show no edema. neuro grossly intact  ECG Sinus rhythm at a rate of 85. Diffuse nonspecific T-wave changes. Prolonged QT.

## 2014-02-02 NOTE — Assessment & Plan Note (Signed)
LV function has normalized on most recent echocardiogram. Continue ACE inhibitor and beta blocker.

## 2014-02-02 NOTE — Patient Instructions (Signed)
Your physician wants you to follow-up in: ONE YEAR WITH DR Shelda Pal will receive a reminder letter in the mail two months in advance. If you don't receive a letter, please call our office to schedule the follow-up appointment.   TAKE FUROSEMIDE ONCE DAILY AS NEEDED FOR SWELLING OR SHORTNESS OF BREATH

## 2014-02-02 NOTE — Assessment & Plan Note (Signed)
Patient is euvolemic on examination. I will change Lasix to 20 mg daily as needed. Continue low-sodium diet.

## 2014-02-28 ENCOUNTER — Other Ambulatory Visit: Payer: Self-pay | Admitting: Cardiology

## 2014-03-30 ENCOUNTER — Other Ambulatory Visit: Payer: Self-pay | Admitting: Cardiology

## 2014-04-12 ENCOUNTER — Other Ambulatory Visit: Payer: Self-pay | Admitting: Cardiology

## 2014-05-03 ENCOUNTER — Other Ambulatory Visit: Payer: Self-pay | Admitting: Cardiology

## 2014-08-19 ENCOUNTER — Ambulatory Visit (INDEPENDENT_AMBULATORY_CARE_PROVIDER_SITE_OTHER): Payer: BC Managed Care – PPO | Admitting: Gynecology

## 2014-08-19 ENCOUNTER — Other Ambulatory Visit (HOSPITAL_COMMUNITY)
Admission: RE | Admit: 2014-08-19 | Discharge: 2014-08-19 | Disposition: A | Discharge status: Home / Self Care | Payer: BC Managed Care – PPO | Source: Ambulatory Visit | Attending: Gynecology | Admitting: Gynecology

## 2014-08-19 ENCOUNTER — Encounter: Payer: Self-pay | Admitting: Gynecology

## 2014-08-19 VITALS — BP 120/76 | Ht 64.0 in | Wt 132.0 lb

## 2014-08-19 DIAGNOSIS — Z30431 Encounter for routine checking of intrauterine contraceptive device: Secondary | ICD-10-CM

## 2014-08-19 DIAGNOSIS — Z01419 Encounter for gynecological examination (general) (routine) without abnormal findings: Secondary | ICD-10-CM

## 2014-08-19 DIAGNOSIS — Z1151 Encounter for screening for human papillomavirus (HPV): Secondary | ICD-10-CM | POA: Insufficient documentation

## 2014-08-19 MED ORDER — VALACYCLOVIR HCL 500 MG PO TABS: 500.0000 mg | ORAL_TABLET | Freq: Every day | ORAL | Status: DC

## 2014-08-19 NOTE — Patient Instructions (Signed)
You may obtain a copy of any labs that were done today by logging onto MyChart as outlined in the instructions provided with your AVS (after visit summary). The office will not call with normal lab results but certainly if there are any significant abnormalities then we will contact you.   Health Maintenance, Female A healthy lifestyle and preventative care can promote health and wellness.  Maintain regular health, dental, and eye exams.  Eat a healthy diet. Foods like vegetables, fruits, whole grains, low-fat dairy products, and lean protein foods contain the nutrients you need without too many calories. Decrease your intake of foods high in solid fats, added sugars, and salt. Get information about a proper diet from your caregiver, if necessary.  Regular physical exercise is one of the most important things you can do for your health. Most adults should get at least 150 minutes of moderate-intensity exercise (any activity that increases your heart rate and causes you to sweat) each week. In addition, most adults need muscle-strengthening exercises on 2 or more days a week.   Maintain a healthy weight. The body mass index (BMI) is a screening tool to identify possible weight problems. It provides an estimate of body fat based on height and weight. Your caregiver can help determine your BMI, and can help you achieve or maintain a healthy weight. For adults 20 years and older:  A BMI below 18.5 is considered underweight.  A BMI of 18.5 to 24.9 is normal.  A BMI of 25 to 29.9 is considered overweight.  A BMI of 30 and above is considered obese.  Maintain normal blood lipids and cholesterol by exercising and minimizing your intake of saturated fat. Eat a balanced diet with plenty of fruits and vegetables. Blood tests for lipids and cholesterol should begin at age 61 and be repeated every 5 years. If your lipid or cholesterol levels are high, you are over 50, or you are a high risk for heart  disease, you may need your cholesterol levels checked more frequently.Ongoing high lipid and cholesterol levels should be treated with medicines if diet and exercise are not effective.  If you smoke, find out from your caregiver how to quit. If you do not use tobacco, do not start.  Lung cancer screening is recommended for adults aged 33 80 years who are at high risk for developing lung cancer because of a history of smoking. Yearly low-dose computed tomography (CT) is recommended for people who have at least a 30-pack-year history of smoking and are a current smoker or have quit within the past 15 years. A pack year of smoking is smoking an average of 1 pack of cigarettes a day for 1 year (for example: 1 pack a day for 30 years or 2 packs a day for 15 years). Yearly screening should continue until the smoker has stopped smoking for at least 15 years. Yearly screening should also be stopped for people who develop a health problem that would prevent them from having lung cancer treatment.  If you are pregnant, do not drink alcohol. If you are breastfeeding, be very cautious about drinking alcohol. If you are not pregnant and choose to drink alcohol, do not exceed 1 drink per day. One drink is considered to be 12 ounces (355 mL) of beer, 5 ounces (148 mL) of wine, or 1.5 ounces (44 mL) of liquor.  Avoid use of street drugs. Do not share needles with anyone. Ask for help if you need support or instructions about stopping  the use of drugs.  High blood pressure causes heart disease and increases the risk of stroke. Blood pressure should be checked at least every 1 to 2 years. Ongoing high blood pressure should be treated with medicines, if weight loss and exercise are not effective.  If you are 59 to 52 years old, ask your caregiver if you should take aspirin to prevent strokes.  Diabetes screening involves taking a blood sample to check your fasting blood sugar level. This should be done once every 3  years, after age 91, if you are within normal weight and without risk factors for diabetes. Testing should be considered at a younger age or be carried out more frequently if you are overweight and have at least 1 risk factor for diabetes.  Breast cancer screening is essential preventative care for women. You should practice "breast self-awareness." This means understanding the normal appearance and feel of your breasts and may include breast self-examination. Any changes detected, no matter how small, should be reported to a caregiver. Women in their 66s and 30s should have a clinical breast exam (CBE) by a caregiver as part of a regular health exam every 1 to 3 years. After age 101, women should have a CBE every year. Starting at age 100, women should consider having a mammogram (breast X-ray) every year. Women who have a family history of breast cancer should talk to their caregiver about genetic screening. Women at a high risk of breast cancer should talk to their caregiver about having an MRI and a mammogram every year.  Breast cancer gene (BRCA)-related cancer risk assessment is recommended for women who have family members with BRCA-related cancers. BRCA-related cancers include breast, ovarian, tubal, and peritoneal cancers. Having family members with these cancers may be associated with an increased risk for harmful changes (mutations) in the breast cancer genes BRCA1 and BRCA2. Results of the assessment will determine the need for genetic counseling and BRCA1 and BRCA2 testing.  The Pap test is a screening test for cervical cancer. Women should have a Pap test starting at age 57. Between ages 25 and 35, Pap tests should be repeated every 2 years. Beginning at age 37, you should have a Pap test every 3 years as long as the past 3 Pap tests have been normal. If you had a hysterectomy for a problem that was not cancer or a condition that could lead to cancer, then you no longer need Pap tests. If you are  between ages 50 and 76, and you have had normal Pap tests going back 10 years, you no longer need Pap tests. If you have had past treatment for cervical cancer or a condition that could lead to cancer, you need Pap tests and screening for cancer for at least 20 years after your treatment. If Pap tests have been discontinued, risk factors (such as a new sexual partner) need to be reassessed to determine if screening should be resumed. Some women have medical problems that increase the chance of getting cervical cancer. In these cases, your caregiver may recommend more frequent screening and Pap tests.  The human papillomavirus (HPV) test is an additional test that may be used for cervical cancer screening. The HPV test looks for the virus that can cause the cell changes on the cervix. The cells collected during the Pap test can be tested for HPV. The HPV test could be used to screen women aged 44 years and older, and should be used in women of any age  who have unclear Pap test results. After the age of 55, women should have HPV testing at the same frequency as a Pap test.  Colorectal cancer can be detected and often prevented. Most routine colorectal cancer screening begins at the age of 44 and continues through age 20. However, your caregiver may recommend screening at an earlier age if you have risk factors for colon cancer. On a yearly basis, your caregiver may provide home test kits to check for hidden blood in the stool. Use of a small camera at the end of a tube, to directly examine the colon (sigmoidoscopy or colonoscopy), can detect the earliest forms of colorectal cancer. Talk to your caregiver about this at age 86, when routine screening begins. Direct examination of the colon should be repeated every 5 to 10 years through age 13, unless early forms of pre-cancerous polyps or small growths are found.  Hepatitis C blood testing is recommended for all people born from [birth year] through 1964 and any  individual with known risks for hepatitis C.  Practice safe sex. Use condoms and avoid high-risk sexual practices to reduce the spread of sexually transmitted infections (STIs). Sexually active women aged 36 and younger should be checked for Chlamydia, which is a common sexually transmitted infection. Older women with new or multiple partners should also be tested for Chlamydia. Testing for other STIs is recommended if you are sexually active and at increased risk.  Osteoporosis is a disease in which the bones lose minerals and strength with aging. This can result in serious bone fractures. The risk of osteoporosis can be identified using a bone density scan. Women ages 20 and over and women at risk for fractures or osteoporosis should discuss screening with their caregivers. Ask your caregiver whether you should be taking a calcium supplement or vitamin D to reduce the rate of osteoporosis.  Menopause can be associated with physical symptoms and risks. Hormone replacement therapy is available to decrease symptoms and risks. You should talk to your caregiver about whether hormone replacement therapy is right for you.  Use sunscreen. Apply sunscreen liberally and repeatedly throughout the day. You should seek shade when your shadow is shorter than you. Protect yourself by wearing long sleeves, pants, a wide-brimmed hat, and sunglasses year round, whenever you are outdoors.  Notify your caregiver of new moles or changes in moles, especially if there is a change in shape or color. Also notify your caregiver if a mole is larger than the size of a pencil eraser.  Stay current with your immunizations. Document Released: 04/09/2011 Document Revised: 01/19/2013 Document Reviewed: 04/09/2011 [facility] Patient Information 2014 [employer].

## 2014-08-19 NOTE — Addendum Note (Signed)
Addended by: Dayna Barker on: 08/19/2014 04:28 PM   Modules accepted: Orders, SmartSet

## 2014-08-19 NOTE — Progress Notes (Signed)
Krystal Bradley 1961/08/01 [id number]        53 y.o.  [id number] for annual exam.  Has not been in the practice for 20 years. Has been receiving routine GYN care.  Several issues noted below.  Past medical history,surgical history, problem list, medications, allergies, family history and social history were all reviewed and documented as reviewed in the EPIC chart.  ROS:  12 system ROS performed with pertinent positives and negatives included in the history, assessment and plan.   Additional significant findings :  none   Exam: [facility] Vitals:   08/19/14 1520  BP: 120/76  Height: 5\' 4"  (1.626 m)  Weight: 132 lb (59.875 kg)   General appearance:  Normal affect, orientation and appearance. Skin: Grossly normal HEENT: Without gross lesions.  No cervical or supraclavicular adenopathy. Thyroid normal.  Lungs:  Clear without wheezing, rales or rhonchi Cardiac: RR, without RMG Abdominal:  Soft, nontender, without masses, guarding, rebound, organomegaly or hernia Breasts:  Examined lying and sitting without masses, retractions, discharge or axillary adenopathy. Pelvic:  Ext/BUS/vagina normal  Cervix normal with IUD string visualized. Pap/HPV  Uterus anteverted axial, normal size, shape and contour, midline and mobile nontender   Adnexa  Without masses or tenderness    Anus and perineum  Normal   Rectovaginal  Normal sphincter tone without palpated masses or tenderness.    Assessment/Plan:  53 y.o. [id number] female for annual exam without menses, Mirena IUD.   1. Mirena IUD 10/2009. Patient is status post BTL. Had Mirena IUD placed for menstrual control. Doing well without menses. Options to include replacing IUD this coming January at the five-year mark, removing and monitoring for menses and symptoms or leaving in place for ongoing menstrual suppression recognizing she does have a BTL. At this point the patient would rather leave in place. She understands it is offbrand labeling and  is comfortable with this. She'll report any onset of bleeding. Will check baseline FSH now. Otherwise doing well without menopausal symptoms such as hot flashes night sweats vaginal dryness. 2. Pap smear 2014 historically. Do not have a copy of these results. Does have a history of cone biopsy in the past with negative follow up Pap smears afterwards. Pap/HPV done today. 3. Mammography 07/2014. Continue with annual mammography. SBE monthly reviewed. 4. Colonoscopy 2011. Repeat at their recommended interval. 5. 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 Has been on for about a year and wants to continue. Refill 1 year provided. 6. Health maintenance. Baseline CBC comprehensive metabolic panel lipid profile urinalysis TSH vitamin D and FSH ordered. Follow up in one year, sooner bleeding, other issues or she decides to have her IUD removed regardless.Dara Lords MD, 3:44 PM 08/19/2014

## 2014-08-20 ENCOUNTER — Other Ambulatory Visit: Payer: Self-pay | Admitting: Gynecology

## 2014-08-20 DIAGNOSIS — E7800 Pure hypercholesterolemia, unspecified: Secondary | ICD-10-CM

## 2014-08-20 DIAGNOSIS — R7309 Other abnormal glucose: Secondary | ICD-10-CM

## 2014-08-20 LAB — CBC WITH DIFFERENTIAL/PLATELET
BASOS ABS: 0 10*3/uL (ref 0.0–0.1)
Basophils Relative: 0 % (ref 0–1)
Eosinophils Absolute: 0.2 10*3/uL (ref 0.0–0.7)
Eosinophils Relative: 3 % (ref 0–5)
HEMATOCRIT: 39.2 % (ref 36.0–46.0)
Hemoglobin: 13 g/dL (ref 12.0–15.0)
LYMPHS PCT: 34 % (ref 12–46)
Lymphs Abs: 2.6 10*3/uL (ref 0.7–4.0)
MCH: 30.4 pg (ref 26.0–34.0)
MCHC: 33.2 g/dL (ref 30.0–36.0)
MCV: 91.8 fL (ref 78.0–100.0)
Monocytes Absolute: 0.7 10*3/uL (ref 0.1–1.0)
Monocytes Relative: 9 % (ref 3–12)
NEUTROS ABS: 4.1 10*3/uL (ref 1.7–7.7)
NEUTROS PCT: 54 % (ref 43–77)
Platelets: 198 10*3/uL (ref 150–400)
RBC: 4.27 MIL/uL (ref 3.87–5.11)
RDW: 13.6 % (ref 11.5–15.5)
WBC: 7.5 10*3/uL (ref 4.0–10.5)

## 2014-08-20 LAB — COMPREHENSIVE METABOLIC PANEL
ALBUMIN: 4.6 g/dL (ref 3.5–5.2)
ALT: 15 U/L (ref 0–35)
AST: 21 U/L (ref 0–37)
Alkaline Phosphatase: 64 U/L (ref 39–117)
BUN: 15 mg/dL (ref 6–23)
CALCIUM: 9.1 mg/dL (ref 8.4–10.5)
CHLORIDE: 103 meq/L (ref 96–112)
CO2: 25 mEq/L (ref 19–32)
Creat: 0.79 mg/dL (ref 0.50–1.10)
Glucose, Bld: 107 mg/dL — ABNORMAL HIGH (ref 70–99)
POTASSIUM: 3.8 meq/L (ref 3.5–5.3)
SODIUM: 139 meq/L (ref 135–145)
TOTAL PROTEIN: 7.1 g/dL (ref 6.0–8.3)
Total Bilirubin: 0.4 mg/dL (ref 0.2–1.2)

## 2014-08-20 LAB — VITAMIN D 25 HYDROXY (VIT D DEFICIENCY, FRACTURES): VIT D 25 HYDROXY: 36 ng/mL (ref 30–89)

## 2014-08-20 LAB — FOLLICLE STIMULATING HORMONE: FSH: 117.3 m[IU]/mL — ABNORMAL HIGH

## 2014-08-20 LAB — URINALYSIS W MICROSCOPIC + REFLEX CULTURE
Bilirubin Urine: NEGATIVE
CASTS: NONE SEEN
Crystals: NONE SEEN
GLUCOSE, UA: NEGATIVE mg/dL
Hgb urine dipstick: NEGATIVE
Ketones, ur: NEGATIVE mg/dL
LEUKOCYTES UA: NEGATIVE
Nitrite: NEGATIVE
PH: 7 (ref 5.0–8.0)
Protein, ur: NEGATIVE mg/dL
SPECIFIC GRAVITY, URINE: 1.023 (ref 1.005–1.030)
Urobilinogen, UA: 1 mg/dL (ref 0.0–1.0)

## 2014-08-20 LAB — LIPID PANEL
Cholesterol: 221 mg/dL — ABNORMAL HIGH (ref 0–200)
HDL: 57 mg/dL (ref >39)
LDL Cholesterol: 138 mg/dL — ABNORMAL HIGH (ref 0–99)
Total CHOL/HDL Ratio: 3.9 Ratio
Triglycerides: 128 mg/dL (ref <150)
VLDL: 26 mg/dL (ref 0–40)

## 2014-08-20 LAB — TSH: TSH: 1.306 u[IU]/mL (ref 0.350–4.500)

## 2014-08-21 LAB — URINE CULTURE
COLONY COUNT: NO GROWTH
ORGANISM ID, BACTERIA: NO GROWTH

## 2014-08-23 ENCOUNTER — Encounter: Payer: Self-pay | Admitting: Gynecology

## 2014-08-23 ENCOUNTER — Other Ambulatory Visit: Payer: Self-pay

## 2014-08-23 LAB — CYTOLOGY - PAP

## 2014-08-23 MED ORDER — LORAZEPAM 0.5 MG PO TABS: 0.5000 mg | ORAL_TABLET | Freq: Three times a day (TID) | ORAL | Status: DC

## 2014-08-23 NOTE — Telephone Encounter (Signed)
Patient said she uses it for job related stress and maybe uses it a couple of times a week once a day.

## 2014-08-23 NOTE — Telephone Encounter (Signed)
Called into pharmacy

## 2014-08-23 NOTE — Telephone Encounter (Signed)
Received refill request as far as lorazepam. Patient and I never discussed this. What is she using this for and how often?

## 2014-08-24 NOTE — Telephone Encounter (Signed)
Tell patient I can do the fluoxetine but I feel more comfortable with the nortriptyline and zonisamide. These are unusual drugs for migraines and I think probably should be monitored by a physician who deals with them specifically like Dr. Amelia Jo or if she has a neurologist she has seen. There are always newer medications coming out and I think she is requiring this type of medication then management by a headache specialist is probably warranted. We can refill those the fluoxetine for year. I would be happy to refill the other medications for 3 months to allow her time to get in to see somebody else.

## 2014-08-25 ENCOUNTER — Encounter: Payer: Self-pay | Admitting: Gynecology

## 2014-08-25 ENCOUNTER — Other Ambulatory Visit: Payer: Self-pay

## 2014-08-25 MED ORDER — ZONISAMIDE 25 MG PO CAPS: 50.0000 mg | ORAL_CAPSULE | Freq: Every day | ORAL | Status: DC

## 2014-08-25 MED ORDER — FLUOXETINE HCL 20 MG PO CAPS: 20.0000 mg | ORAL_CAPSULE | Freq: Every day | ORAL | Status: DC

## 2014-08-25 MED ORDER — NORTRIPTYLINE HCL 25 MG PO CAPS: 25.0000 mg | ORAL_CAPSULE | Freq: Every day | ORAL | Status: DC

## 2014-08-25 NOTE — Telephone Encounter (Signed)
See patients note. Refill the proper dose of Zonisamide for her.

## 2014-08-26 ENCOUNTER — Other Ambulatory Visit: Payer: Self-pay | Admitting: Gynecology

## 2014-08-26 MED ORDER — ZONISAMIDE 100 MG PO CAPS: 100.0000 mg | ORAL_CAPSULE | Freq: Two times a day (BID) | ORAL | Status: DC

## 2014-09-01 ENCOUNTER — Other Ambulatory Visit: Payer: Self-pay | Admitting: Cardiology

## 2014-09-01 NOTE — Telephone Encounter (Signed)
Rx refill sent to patient pharmacy   

## 2014-12-03 ENCOUNTER — Other Ambulatory Visit: Payer: Self-pay | Admitting: Gynecology

## 2014-12-03 NOTE — Telephone Encounter (Signed)
See the 08/23/2014 patient email note. I transiently refill days at that time to allow her to follow up with the neurologist. I do not feel comfortable continuing this type of medication, not saying that it's inappropriate, but I have no experience using it and really should be seen by a neurologist who deals with headaches on a regular basis.

## 2014-12-10 ENCOUNTER — Encounter: Payer: Self-pay | Admitting: Gynecology

## 2014-12-12 NOTE — Telephone Encounter (Signed)
I would suggest having her migraine MD manage all of her migraine meds.

## 2014-12-13 ENCOUNTER — Other Ambulatory Visit: Payer: Self-pay

## 2014-12-14 ENCOUNTER — Other Ambulatory Visit: Payer: Self-pay | Admitting: Gynecology

## 2014-12-15 ENCOUNTER — Telehealth: Payer: Self-pay | Admitting: *Deleted

## 2014-12-15 NOTE — Telephone Encounter (Signed)
Pt called about why Rx for Fioricet , i explained to patient TF said it was best to have headache MD prescribe Rx.

## 2014-12-31 ENCOUNTER — Other Ambulatory Visit: Payer: Self-pay | Admitting: Cardiology

## 2015-02-02 ENCOUNTER — Other Ambulatory Visit: Payer: Self-pay | Admitting: Cardiology

## 2015-02-22 NOTE — Progress Notes (Signed)
HPI: FU cardiomyopathy and congestive heart failure. Echocardiogram in February of 2011 showed an ejection fraction of 20-25%, mild biatrial enlargement, mild mitral regurgitation and moderate tricuspid regurgitation. There is a small pericardial effusion. Cardiac catheterization in 2011 showed an ejection fraction of 15% and normal coronary arteries. Patient was treated medically. She was seen at [facility] for treatment of her heart failure and fu echo showed some improvement of LV function. Last echocardiogram in May 2014 showed an ejection fraction of 50-55% and grade 1 diastolic dysfunction, mild MR. Since I last saw her, there is no dyspnea, chest pain, palpitations or syncope.  Current Outpatient Prescriptions  Medication Sig Dispense Refill  . butalbital-acetaminophen-caffeine (FIORICET WITH CODEINE) 50-325-40-30 MG per capsule Take 1 capsule by mouth every 4 (four) hours as needed.    . carvedilol (COREG) 6.25 MG tablet TAKE 1 TABLET BY MOUTH TWICE DAILY WITH A MEAL 180 tablet 0  . FLUoxetine (PROZAC) 20 MG capsule Take 1 capsule (20 mg total) by mouth daily. 30 capsule 11  . furosemide (LASIX) 20 MG tablet Take 1 tablet (20 mg total) by mouth daily as needed. 90 tablet 3  . levonorgestrel (MIRENA) 20 MCG/24HR IUD 1 each by Intrauterine route once.    Marland Kitchen LORazepam (ATIVAN) 0.5 MG tablet Take 1 tablet (0.5 mg total) by mouth every 8 (eight) hours. 30 tablet 4  . nortriptyline (PAMELOR) 25 MG capsule Take 1 capsule (25 mg total) by mouth at bedtime. 30 capsule 2  . ramipril (ALTACE) 5 MG capsule TAKE ONE CAPSULE BY MOUTH EVERY DAY 90 capsule 0  . ranitidine (ZANTAC) 150 MG tablet Take 150 mg by mouth as needed.      . valACYclovir (VALTREX) 500 MG tablet Take 1 tablet (500 mg total) by mouth daily. 30 tablet 12  . zonisamide (ZONEGRAN) 100 MG capsule Take 1 capsule (100 mg total) by mouth 2 (two) times daily. 60 capsule 2   No current facility-administered medications for this  visit.     Past Medical History  Diagnosis Date  . 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 Migraines   . Depression   . CHF (congestive heart failure)   . GERD (gastroesophageal reflux disease)   . Herpes     Past Surgical History  Procedure Laterality Date  . Ectopic pregnancy surgery    . Dilation and curettage of uterus    . Tonsillectomy    . Mirena      Inserted 10-2009  . Cold knife conization    . Cesarean section    . Tubal ligation    . Cardiac catheterization      History   Social History  . Marital Status: Married    Spouse Name: N/A  . Number of Children: 2  . Years of Education: N/A   Occupational History  .  [employer]   Social History Main Topics  . Smoking status: Never Smoker   . Smokeless tobacco: Not on file  . Alcohol Use: 0.0 oz/week    0 Standard drinks or equivalent per week     Comment: Occasional  . Drug Use: No  . Sexual Activity: Yes    Birth Control/ Protection: IUD, Surgical     Comment: Inserted 10-2009  BTL   Other Topics Concern  . Not on file   Social History Narrative   She is married and has 2 children.  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    ROS: no fevers or chills, productive cough, hemoptysis, dysphasia, odynophagia, melena, hematochezia, dysuria, hematuria, rash, seizure activity, orthopnea, PND, pedal edema, claudication. Remaining systems are negative.  Physical Exam: Well-developed well-nourished in no acute distress.  Skin is warm and dry.  HEENT is normal.  Neck is supple.  Chest is clear to auscultation with normal expansion.  Cardiovascular exam is regular rate and rhythm.  Abdominal exam nontender or distended. No masses palpated. Extremities show no edema. neuro grossly intact  ECG normal sinus rhythm at a rate of 77. Normal axis. Inferior lateral T-wave inversion.

## 2015-02-25 ENCOUNTER — Encounter: Payer: Self-pay | Admitting: Cardiology

## 2015-02-25 ENCOUNTER — Ambulatory Visit (INDEPENDENT_AMBULATORY_CARE_PROVIDER_SITE_OTHER): Payer: BLUE CROSS/BLUE SHIELD | Admitting: Cardiology

## 2015-02-25 VITALS — BP 108/72 | HR 77 | Ht 64.0 in | Wt 132.0 lb

## 2015-02-25 DIAGNOSIS — I429 Cardiomyopathy, unspecified: Secondary | ICD-10-CM | POA: Diagnosis not present

## 2015-02-25 NOTE — Patient Instructions (Signed)
Your physician wants you to follow-up in: 1 Year You will receive a reminder letter in the mail two months in advance. If you don't receive a letter, please call our office to schedule the follow-up appointment.  Your physician has requested that you have an echocardiogram. Echocardiography is a painless test that uses sound waves to create images of your heart. It provides your doctor with information about the size and shape of your heart and how well your heart's chambers and valves are working. This procedure takes approximately one hour. There are no restrictions for this procedure.  Your physician has recommended you make the following change in your medication: Decrease Furosemide to 20 mg daily as needed

## 2015-02-25 NOTE — Assessment & Plan Note (Signed)
Continue ACE inhibitor and beta blocker. Repeat echocardiogram. 

## 2015-02-25 NOTE — Assessment & Plan Note (Signed)
Patient is not volume overloaded. Her LV function has improved. Change Lasix to 20 mg daily as needed for edema, weight gain of 2-3 pounds or dyspnea.

## 2015-02-27 ENCOUNTER — Other Ambulatory Visit: Payer: Self-pay | Admitting: Cardiology

## 2015-05-08 ENCOUNTER — Other Ambulatory Visit: Payer: Self-pay | Admitting: Cardiology

## 2015-05-22 ENCOUNTER — Other Ambulatory Visit: Payer: Self-pay | Admitting: Cardiology

## 2015-06-26 ENCOUNTER — Other Ambulatory Visit: Payer: Self-pay | Admitting: Gynecology

## 2015-06-27 NOTE — Telephone Encounter (Signed)
Called into pharmacy

## 2015-08-04 ENCOUNTER — Other Ambulatory Visit: Payer: Self-pay | Admitting: Gynecology

## 2015-08-04 NOTE — Telephone Encounter (Signed)
Last filled on 06/27/15

## 2015-08-04 NOTE — Telephone Encounter (Signed)
Rx called in 

## 2015-08-23 ENCOUNTER — Encounter: Payer: BC Managed Care – PPO | Admitting: Gynecology

## 2015-09-12 ENCOUNTER — Other Ambulatory Visit: Payer: Self-pay | Admitting: Gynecology

## 2015-09-12 NOTE — Telephone Encounter (Signed)
Annual was due 08/20/15. Not yet scheduled.

## 2016-01-27 ENCOUNTER — Other Ambulatory Visit: Payer: Self-pay

## 2016-01-27 ENCOUNTER — Other Ambulatory Visit: Payer: Self-pay | Admitting: Cardiology

## 2016-01-27 MED ORDER — RAMIPRIL 5 MG PO CAPS: ORAL_CAPSULE | ORAL | Status: DC

## 2016-01-27 NOTE — Telephone Encounter (Signed)
REFILL 

## 2016-02-28 NOTE — Progress Notes (Signed)
HPI: FU cardiomyopathy and congestive heart failure. Echocardiogram in February of 2011 showed an ejection fraction of 20-25%, mild biatrial enlargement, mild mitral regurgitation and moderate tricuspid regurgitation. There is a small pericardial effusion. Cardiac catheterization in 2011 showed an ejection fraction of 15% and normal coronary arteries. Patient was treated medically. She was seen at [facility] for treatment of her heart failure and fu echo showed some improvement of LV function. Last echocardiogram in May 2014 showed an ejection fraction of 50-55% and grade 1 diastolic dysfunction, mild MR. Since I last saw her, the patient denies any dyspnea on exertion, orthopnea, PND, pedal edema, palpitations, syncope or chest pain.   Current Outpatient Prescriptions  Medication Sig Dispense Refill  . butalbital-acetaminophen-caffeine (FIORICET WITH CODEINE) 50-325-40-30 MG per capsule Take 1 capsule by mouth every 4 (four) hours as needed.    Marland Kitchen Butalbital-APAP-Caffeine (FIORICET PO) Take by mouth daily as needed.    . carvedilol (COREG) 6.25 MG tablet TAKE 1 TABLET BY MOUTH TWICE DAILY WITH A MEAL 180 tablet 3  . conjugated estrogens (PREMARIN) vaginal cream Place 1 Applicatorful vaginally 3 (three) times daily.    Marland Kitchen FLUoxetine (PROZAC) 20 MG capsule Take 1 capsule (20 mg total) by mouth daily. 30 capsule 11  . furosemide (LASIX) 20 MG tablet TAKE 1 TABLET BY MOUTH DAILY AS NEEDED 90 tablet 3  . levonorgestrel (MIRENA) 20 MCG/24HR IUD 1 each by Intrauterine route once.    Marland Kitchen LORazepam (ATIVAN) 0.5 MG tablet TAKE 1 TABLET BY MOUTH EVERY 8 HOURS AS NEEDED FOR STRESS 30 tablet 0  . nortriptyline (PAMELOR) 25 MG capsule Take 1 capsule (25 mg total) by mouth at bedtime. 30 capsule 2  . ramipril (ALTACE) 5 MG capsule TAKE 1 CAPSULE BY MOUTH EVERY DAY 90 capsule 0  . ranitidine (ZANTAC) 150 MG tablet Take 150 mg by mouth as needed.      . valACYclovir (VALTREX) 500 MG tablet TAKE 1 TABLET  BY MOUTH EVERY DAY 30 tablet 0  . zolpidem (AMBIEN) 5 MG tablet Take 10 mg by mouth at bedtime as needed for sleep.    Marland Kitchen zonisamide (ZONEGRAN) 100 MG capsule Take 1 capsule (100 mg total) by mouth 2 (two) times daily. 60 capsule 2   No current facility-administered medications for this visit.     Past Medical History  Diagnosis Date  . Dilated cardiomyopathy [facility]) dx Feb 2011    02/10/10 echo EF 20%, 11/09/10 echo 45%  . Migraines   . Depression   . CHF (congestive heart failure) (HCC)   . GERD (gastroesophageal reflux disease)   . Herpes     Past Surgical History  Procedure Laterality Date  . Ectopic pregnancy surgery    . Dilation and curettage of uterus    . Tonsillectomy    . Mirena      Inserted 10-2009  . Cold knife conization    . Cesarean section    . Tubal ligation    . Cardiac catheterization      Social History   Social History  . Marital Status: Married    Spouse Name: N/A  . Number of Children: 2  . Years of Education: N/A   Occupational History  .  [employer]   Social History Main Topics  . Smoking status: Never Smoker   . Smokeless tobacco: Not on file  . Alcohol Use: 0.0 oz/week    0 Standard drinks or equivalent per week  Comment: Occasional  . Drug Use: No  . Sexual Activity: Yes    Birth Control/ Protection: IUD, Surgical     Comment: Inserted 10-2009  BTL   Other Topics Concern  . Not on file   Social History Narrative   She is married and has 2 children.  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    Family History  Problem Relation Age of Onset  . Cancer Mother     Ureter  . Cancer Father     Colon,Gallbladder  . Heart disease Father     Atrial fibrillation  . Cancer Brother     Colon    ROS: Some pain in right rib area but no fevers or chills, productive cough, hemoptysis, dysphasia, odynophagia, melena, hematochezia, dysuria, hematuria, rash, seizure activity, orthopnea, PND, pedal edema,  claudication. Remaining systems are negative.  Physical Exam: Well-developed well-nourished in no acute distress.  Skin is warm and dry.  HEENT is normal.  Neck is supple.  Chest is clear to auscultation with normal expansion. Mild tenderness over right lower rib Cardiovascular exam is regular rate and rhythm.  Abdominal exam nontender or distended. No masses palpated. Extremities show no edema. neuro grossly intact  ECG Sinus rhythm at a rate of 92. Normal axis. Nonspecific ST changes. Prolonged QT interval.

## 2016-03-01 ENCOUNTER — Encounter: Payer: Self-pay | Admitting: Cardiology

## 2016-03-01 ENCOUNTER — Ambulatory Visit (INDEPENDENT_AMBULATORY_CARE_PROVIDER_SITE_OTHER): Payer: 59 | Admitting: Cardiology

## 2016-03-01 VITALS — BP 109/68 | HR 92 | Ht 64.0 in | Wt 135.6 lb

## 2016-03-01 DIAGNOSIS — I5032 Chronic diastolic (congestive) heart failure: Secondary | ICD-10-CM

## 2016-03-01 DIAGNOSIS — I429 Cardiomyopathy, unspecified: Secondary | ICD-10-CM | POA: Diagnosis not present

## 2016-03-01 DIAGNOSIS — R9389 Abnormal findings on diagnostic imaging of other specified body structures: Secondary | ICD-10-CM

## 2016-03-01 DIAGNOSIS — R938 Abnormal findings on diagnostic imaging of other specified body structures: Secondary | ICD-10-CM

## 2016-03-01 NOTE — Assessment & Plan Note (Signed)
Patient is euvolemic on examination. Continue present dose of Lasix as needed. Continue low-sodium diet. Check potassium and renal function.

## 2016-03-01 NOTE — Addendum Note (Signed)
Addended by: Freddi Starr on: 03/01/2016 08:45 AM   Modules accepted: Orders

## 2016-03-01 NOTE — Patient Instructions (Signed)
Medication Instructions:   NO CHANGE  Labwork:  Your physician recommends that you HAVE LAB WORK TODAY  Testing/Procedures:  Your physician has requested that you have an echocardiogram. Echocardiography is a painless test that uses sound waves to create images of your heart. It provides your doctor with information about the size and shape of your heart and how well your heart's chambers and valves are working. This procedure takes approximately one hour. There are no restrictions for this procedure.   NON-CONTRAST CT SCAN TO FOLLOW UP LUNG NODULE  Follow-Up:  Your physician wants you to follow-up in: ONE YEAR WITH DR Shelda Pal will receive a reminder letter in the mail two months in advance. If you don't receive a letter, please call our office to schedule the follow-up appointment.   If you need a refill on your cardiac medications before your next appointment, please call your pharmacy.

## 2016-03-01 NOTE — Assessment & Plan Note (Signed)
Continue ACE inhibitor and beta blocker. Repeat echocardiogram.

## 2016-03-01 NOTE — Assessment & Plan Note (Signed)
Previous chest CT small nodule. Plan repeat study.

## 2016-03-12 ENCOUNTER — Encounter: Payer: Self-pay | Admitting: Cardiology

## 2016-03-16 ENCOUNTER — Ambulatory Visit (INDEPENDENT_AMBULATORY_CARE_PROVIDER_SITE_OTHER)
Admission: RE | Admit: 2016-03-16 | Discharge: 2016-03-16 | Disposition: A | Discharge status: Home / Self Care | Payer: 59 | Source: Ambulatory Visit | Attending: Cardiology | Admitting: Cardiology

## 2016-03-16 ENCOUNTER — Other Ambulatory Visit: Payer: Self-pay

## 2016-03-16 ENCOUNTER — Ambulatory Visit (HOSPITAL_COMMUNITY): Payer: 59 | Attending: Cardiology

## 2016-03-16 DIAGNOSIS — R938 Abnormal findings on diagnostic imaging of other specified body structures: Secondary | ICD-10-CM | POA: Diagnosis not present

## 2016-03-16 DIAGNOSIS — I509 Heart failure, unspecified: Secondary | ICD-10-CM | POA: Insufficient documentation

## 2016-03-16 DIAGNOSIS — I340 Nonrheumatic mitral (valve) insufficiency: Secondary | ICD-10-CM | POA: Diagnosis not present

## 2016-03-16 DIAGNOSIS — I517 Cardiomegaly: Secondary | ICD-10-CM | POA: Diagnosis not present

## 2016-03-16 DIAGNOSIS — I071 Rheumatic tricuspid insufficiency: Secondary | ICD-10-CM | POA: Diagnosis not present

## 2016-03-16 DIAGNOSIS — I429 Cardiomyopathy, unspecified: Secondary | ICD-10-CM | POA: Insufficient documentation

## 2016-03-16 DIAGNOSIS — R9389 Abnormal findings on diagnostic imaging of other specified body structures: Secondary | ICD-10-CM

## 2016-03-16 LAB — ECHOCARDIOGRAM COMPLETE
AOASC: 34 cm
FS: 29 % (ref 28–44)
IVS/LV PW RATIO, ED: 1.07
LA ID, A-P, ES: 33 mm
LA diam end sys: 33 mm
LA diam index: 1.97 cm/m2
LA vol index: 22.1 mL/m2
LAVOL: 37 mL
LAVOLA4C: 31.8 mL
LV TDI E'LATERAL: 9.64
LV TDI E'MEDIAL: 6.57
LV e' LATERAL: 9.64 cm/s
LVOT VTI: 19.5 cm
LVOT area: 3.14 cm2
LVOTD: 20 mm
LVOTPV: 100 cm/s
LVOTSV: 61 mL
Lateral S' vel: 11 cm/s
PW: 10.1 mm — AB (ref 0.6–1.1)

## 2016-03-19 ENCOUNTER — Encounter: Payer: Self-pay | Admitting: Cardiology

## 2016-04-01 ENCOUNTER — Other Ambulatory Visit: Payer: Self-pay | Admitting: Cardiovascular Disease

## 2016-04-02 NOTE — Telephone Encounter (Signed)
Rx(s) sent to pharmacy electronically.  

## 2016-04-18 ENCOUNTER — Other Ambulatory Visit: Payer: Self-pay | Admitting: Gynecology

## 2016-06-04 ENCOUNTER — Other Ambulatory Visit: Payer: Self-pay | Admitting: Cardiology

## 2016-06-04 NOTE — Telephone Encounter (Signed)
Rx request sent to pharmacy.  

## 2016-06-12 ENCOUNTER — Other Ambulatory Visit: Payer: Self-pay | Admitting: Cardiology

## 2016-09-10 ENCOUNTER — Emergency Department (HOSPITAL_COMMUNITY)
Admission: EM | Admit: 2016-09-10 | Discharge: 2016-09-10 | Disposition: A | Discharge status: Home / Self Care | Payer: 59 | Attending: Emergency Medicine | Admitting: Emergency Medicine

## 2016-09-10 ENCOUNTER — Emergency Department (HOSPITAL_COMMUNITY): Payer: 59

## 2016-09-10 ENCOUNTER — Encounter (HOSPITAL_COMMUNITY): Payer: Self-pay

## 2016-09-10 DIAGNOSIS — Z79899 Other long term (current) drug therapy: Secondary | ICD-10-CM | POA: Insufficient documentation

## 2016-09-10 DIAGNOSIS — N200 Calculus of kidney: Secondary | ICD-10-CM | POA: Diagnosis not present

## 2016-09-10 DIAGNOSIS — I509 Heart failure, unspecified: Secondary | ICD-10-CM | POA: Insufficient documentation

## 2016-09-10 DIAGNOSIS — R109 Unspecified abdominal pain: Secondary | ICD-10-CM | POA: Diagnosis present

## 2016-09-10 LAB — URINE MICROSCOPIC-ADD ON

## 2016-09-10 LAB — URINALYSIS, ROUTINE W REFLEX MICROSCOPIC
GLUCOSE, UA: NEGATIVE mg/dL
KETONES UR: NEGATIVE mg/dL
NITRITE: NEGATIVE
PH: 6 (ref 5.0–8.0)
Protein, ur: 30 mg/dL — AB
SPECIFIC GRAVITY, URINE: 1.027 (ref 1.005–1.030)

## 2016-09-10 LAB — BASIC METABOLIC PANEL
Anion gap: 9 (ref 5–15)
BUN: 20 mg/dL (ref 6–20)
CHLORIDE: 105 mmol/L (ref 101–111)
CO2: 23 mmol/L (ref 22–32)
Calcium: 9.4 mg/dL (ref 8.9–10.3)
Creatinine, Ser: 0.98 mg/dL (ref 0.44–1.00)
GFR calc Af Amer: >60 mL/min (ref >60)
GFR calc non Af Amer: >60 mL/min (ref >60)
Glucose, Bld: 119 mg/dL — ABNORMAL HIGH (ref 65–99)
POTASSIUM: 3.9 mmol/L (ref 3.5–5.1)
SODIUM: 137 mmol/L (ref 135–145)

## 2016-09-10 LAB — CBC WITH DIFFERENTIAL/PLATELET
Basophils Absolute: 0 10*3/uL (ref 0.0–0.1)
Basophils Relative: 0 %
EOS ABS: 0.1 10*3/uL (ref 0.0–0.7)
Eosinophils Relative: 1 %
HEMATOCRIT: 40.3 % (ref 36.0–46.0)
HEMOGLOBIN: 13.6 g/dL (ref 12.0–15.0)
LYMPHS ABS: 1.3 10*3/uL (ref 0.7–4.0)
LYMPHS PCT: 8 %
MCH: 30.7 pg (ref 26.0–34.0)
MCHC: 33.7 g/dL (ref 30.0–36.0)
MCV: 91 fL (ref 78.0–100.0)
MONOS PCT: 6 %
Monocytes Absolute: 0.9 10*3/uL (ref 0.1–1.0)
NEUTROS ABS: 14.2 10*3/uL — AB (ref 1.7–7.7)
NEUTROS PCT: 85 %
Platelets: 204 10*3/uL (ref 150–400)
RBC: 4.43 MIL/uL (ref 3.87–5.11)
RDW: 13.1 % (ref 11.5–15.5)
WBC: 16.5 10*3/uL — ABNORMAL HIGH (ref 4.0–10.5)

## 2016-09-10 MED ORDER — OXYCODONE-ACETAMINOPHEN 5-325 MG PO TABS
1.0000 | ORAL_TABLET | Freq: Once | ORAL | Status: AC
  Administered 2016-09-10: 1 via ORAL
  Filled 2016-09-10: qty 1

## 2016-09-10 MED ORDER — ONDANSETRON HCL 4 MG PO TABS: 4.0000 mg | ORAL_TABLET | Freq: Four times a day (QID) | ORAL | 0 refills | Status: DC

## 2016-09-10 MED ORDER — ONDANSETRON HCL 4 MG/2ML IJ SOLN
4.0000 mg | Freq: Once | INTRAMUSCULAR | Status: AC
  Administered 2016-09-10: 4 mg via INTRAVENOUS
  Filled 2016-09-10: qty 2

## 2016-09-10 MED ORDER — MORPHINE SULFATE (PF) 4 MG/ML IV SOLN
4.0000 mg | Freq: Once | INTRAVENOUS | Status: AC
Start: 2016-09-10 — End: 2016-09-10
  Administered 2016-09-10: 4 mg via INTRAVENOUS
  Filled 2016-09-10: qty 1

## 2016-09-10 MED ORDER — OXYCODONE-ACETAMINOPHEN 5-325 MG PO TABS: 2.0000 | ORAL_TABLET | ORAL | 0 refills | Status: DC | PRN

## 2016-09-10 NOTE — Discharge Instructions (Signed)
Make sure you stay well hydrated, drink plenty of water  Take pain medication (percocet) as prescribed as needed for pain  Take ondansetron (zofran) for nausea as prescribed for nausea   Call and make an appointment with Urologist for re-evaluation and long term treatment   Return to ED if you develop worsening pain non responsive to pain medication, nausea and vomiting not responding to nausea medication, fevers or blood in urine

## 2016-09-10 NOTE — ED Provider Notes (Signed)
[facility]-EMERGENCY DEPT Provider Note   CSN: [id number] Arrival date & time: 09/10/16  [id number]  History   Chief Complaint Chief Complaint  Patient presents with  . Flank Pain    HPI Krystal Bradley is a 55 y.o. female with pmh of cardiomyopathy and congestive heart failure (last echo 02/2016 with EF 50-55%) presents to ED with L sided flank pain that started yesterday associated with nausea, urinary frequency and urgency.  Pt states L sided flank pain now radiates to L groin.  L flank pain was intermittent last night but is now a constant 8/10.  Pt denies fever, vomiting, chest pain, abdominal pain, pelvic pain, blood in urine.  No h/o kidney stones in the past.    HPI  Past Medical History:  Diagnosis Date  . CHF (congestive heart failure) (HCC)   . Depression   . Dilated cardiomyopathy [facility](HCC) dx Feb 2011   02/10/10 echo EF 20%, 11/09/10 echo 45%  . GERD (gastroesophageal reflux disease)   . Herpes   . Migraines     Patient Active Problem List   Diagnosis Date Noted  . Abnormal chest CT 03/01/2016  . Cardiomyopathy (HCC) 01/30/2012  . Congestive heart failure (HCC) 01/30/2012    Past Surgical History:  Procedure Laterality Date  . CARDIAC CATHETERIZATION    . CESAREAN SECTION    . Cold knife conization    . DILATION AND CURETTAGE OF UTERUS    . ECTOPIC PREGNANCY SURGERY    . Mirena     Inserted 10-2009  . Tonsillectomy    . TUBAL LIGATION      OB History    Gravida Para Term Preterm AB Living   5 2     3 2    SAB TAB Ectopic Multiple Live Births   2   1           Home Medications    Prior to Admission medications   Medication Sig Start Date End Date Taking? Authorizing Provider  butalbital-acetaminophen-caffeine (FIORICET, ESGIC) 50-325-40 MG tablet Take 1 tablet by mouth every 6 (six) hours as needed for headache.   Yes Historical Provider, MD  carvedilol (COREG) 6.25 MG tablet Take 6.25 mg by mouth 2 (two) times daily with a meal.   Yes Historical Provider, MD    FLUoxetine (PROZAC) 20 MG capsule Take 20 mg by mouth at bedtime.   Yes Historical Provider, MD  levonorgestrel (MIRENA) 20 MCG/24HR IUD 1 each by Intrauterine route once.   Yes Historical Provider, MD  LORazepam (ATIVAN) 0.5 MG tablet Take 0.5 mg by mouth every 8 (eight) hours as needed for anxiety.   Yes Historical Provider, MD  nortriptyline (PAMELOR) 25 MG capsule Take 1 capsule (25 mg total) by mouth at bedtime. 08/25/14  Yes Dara Lordsimothy P Fontaine, MD  ramipril (ALTACE) 5 MG capsule Take 5 mg by mouth at bedtime.   Yes Historical Provider, MD  ranitidine (ZANTAC) 150 MG tablet Take 150 mg by mouth 2 (two) times daily as needed for heartburn.    Yes Historical Provider, MD  valACYclovir (VALTREX) 500 MG tablet Take 500 mg by mouth at bedtime.   Yes Historical Provider, MD  zolpidem (AMBIEN) 5 MG tablet Take 5 mg by mouth at bedtime as needed for sleep.    Yes Historical Provider, MD  zonisamide (ZONEGRAN) 100 MG capsule Take 300 mg by mouth at bedtime.   Yes Historical Provider, MD  ondansetron (ZOFRAN) 4 MG tablet Take 1 tablet (4 mg total) by mouth every  6 (six) hours. 09/10/16   Liberty Handy, PA-C  oxyCODONE-acetaminophen (PERCOCET/ROXICET) 5-325 MG tablet Take 2 tablets by mouth every 4 (four) hours as needed for severe pain. 09/10/16   Liberty Handy, PA-C    Family History Family History  Problem Relation Age of Onset  . Cancer Mother     Ureter  . Cancer Father     Colon,Gallbladder  . Heart disease Father     Atrial fibrillation  . Cancer Brother     Colon    Social History Social History  Substance Use Topics  . Smoking status: Never Smoker  . Smokeless tobacco: Not on file  . Alcohol use 0.0 oz/week     Comment: Occasional     Allergies   Ibuprofen; Penicillins; and Cephalosporins   Review of Systems Review of Systems  All other systems reviewed and are negative.    Physical Exam Updated Vital Signs BP 115/65 (BP Location: Right Arm)   Pulse 76    Temp 97.5 F (36.4 C) (Oral)   Resp 18   Ht 5\' 4"  (1.626 m)   Wt 61.2 kg   SpO2 100%   BMI 23.17 kg/m   Physical Exam  Constitutional: She is oriented to person, place, and time. Vital signs are normal. She appears well-developed and well-nourished.  Pt found pacing/walking in room.  HENT:  Head: Normocephalic and atraumatic.  Mouth/Throat: Oropharynx is clear and moist.  Eyes: EOM are normal. Pupils are equal, round, and reactive to light.  Neck: Normal range of motion. Neck supple.  Cardiovascular: Normal rate, regular rhythm, normal heart sounds and intact distal pulses.   No murmur heard. No lower extremity edema. No orthopnea with HOB flat.  Pulmonary/Chest: Effort normal and breath sounds normal. No respiratory distress.  Abdominal: Soft. She exhibits no distension. There is no tenderness. There is no guarding.  + Left CVAT.  Musculoskeletal: Normal range of motion.  Lymphadenopathy:    She has no cervical adenopathy.  Neurological: She is alert and oriented to person, place, and time.  Skin: Skin is warm and dry.  Psychiatric: She has a normal mood and affect. Her behavior is normal.  Nursing note and vitals reviewed.    ED Treatments / Results  Labs (all labs ordered are listed, but only abnormal results are displayed) Labs Reviewed  URINALYSIS, ROUTINE W REFLEX MICROSCOPIC (NOT AT [facility]) - Abnormal; Notable for the following:       Result Value   Color, Urine AMBER (*)    APPearance CLOUDY (*)    Hgb urine dipstick LARGE (*)    Bilirubin Urine SMALL (*)    Protein, ur 30 (*)    Leukocytes, UA TRACE (*)    All other components within normal limits  URINE MICROSCOPIC-ADD ON - Abnormal; Notable for the following:    Squamous Epithelial / LPF 0-5 (*)    Bacteria, UA FEW (*)    All other components within normal limits  CBC WITH DIFFERENTIAL/PLATELET - Abnormal; Notable for the following:    WBC 16.5 (*)    Neutro Abs 14.2 (*)    All other components within  normal limits  BASIC METABOLIC PANEL - Abnormal; Notable for the following:    Glucose, Bld 119 (*)    All other components within normal limits    EKG  EKG Interpretation None       Radiology Ct Abdomen Pelvis Wo Contrast  Result Date: 09/10/2016 CLINICAL DATA:  Left flank pain yesterday. EXAM: CT  ABDOMEN AND PELVIS WITHOUT CONTRAST TECHNIQUE: Multidetector CT imaging of the abdomen and pelvis was performed following the standard protocol without IV contrast. COMPARISON:  11/25/2009 FINDINGS: Lower chest:  Unremarkable. Hepatobiliary: No focal abnormality in the liver on this study without intravenous contrast. There is no evidence for gallstones, gallbladder wall thickening, or pericholecystic fluid. No intrahepatic or extrahepatic biliary dilation. Pancreas: No focal mass lesion. No dilatation of the main duct. No intraparenchymal cyst. No peripancreatic edema. Spleen: No splenomegaly. No focal mass lesion. Adrenals/Urinary Tract: No adrenal nodule or mass. Right kidney is unremarkable. There is mild fullness left intrarenal collecting system and renal pelvis. 1 mm nonobstructing stone identified in the lower pole. 5 x 5 x 6 mm stone identified left UPJ. 12 mm exophytic lesion upper pole left kidney measures water attenuation and is not compatible with a cyst 8 mm exophytic lesion posterior left lower pole has attenuation too high to represent a simple cyst. No ureteral or bladder stones. Stomach/Bowel: Tiny hiatal hernia noted. Stomach otherwise unremarkable. Duodenum is normally positioned as is the ligament of Treitz. No small bowel wall thickening. No small bowel dilatation. The terminal ileum is normal. The appendix is normal. No gross colonic mass. No colonic wall thickening. No substantial diverticular change. Vascular/Lymphatic: There is abdominal aortic atherosclerosis without aneurysm. There is no gastrohepatic or hepatoduodenal ligament lymphadenopathy. No intraperitoneal or  retroperitoneal lymphadenopathy. No pelvic sidewall lymphadenopathy. Reproductive: IUD noted in the uterus.  There is no adnexal mass. Other: No intraperitoneal free fluid. Musculoskeletal: Bone windows reveal no worrisome lytic or sclerotic osseous lesions. IMPRESSION: 1. 5 x 5 x 6 mm left UPJ stone causes mild left hydronephrosis. 2. 8 mm exophytic lesion posterior lower pole left kidney has attenuation too high to represent a simple cyst. While likely a cyst complicated by proteinaceous debris or hemorrhage, neoplasm could present with similar appearance on uninfused CT imaging in this finding is new since 11/25/2009. MRI without and with contrast could be used to further evaluate. 3. 1-2 mm nonobstructing stone lower pole left kidney. 4. Abdominal aortic atherosclerosis. Electronically Signed   By: Kennith Center M.D.   On: 09/10/2016 11:15    Procedures Procedures (including critical care time)  Medications Ordered in ED Medications  ondansetron (ZOFRAN) injection 4 mg (4 mg Intravenous Given 09/10/16 1016)  morphine 4 MG/ML injection 4 mg (4 mg Intravenous Given 09/10/16 1016)  oxyCODONE-acetaminophen (PERCOCET/ROXICET) 5-325 MG per tablet 1 tablet (1 tablet Oral Given 09/10/16 1252)     Initial Impression / Assessment and Plan / ED Course  I have reviewed the triage vital signs and the nursing notes.  Pertinent labs & imaging results that were available during my care of the patient were reviewed by me and considered in my medical decision making (see chart for details).  Clinical Course    55 yo female presents with progressively worsening L sided flank pain with radiation to L groin since yesterday a/w urinary symptoms and nausea.  On exam pt is afebrile, non-tachy and normotensive.  RRR, lungs clear, abdomen soft/nontender/without guarding or rigidity.  + Left CVAT.  BMP remarkable for mild hyperglycemia and normal creatinine.  CBC remarkable for leukocytosis (WBC 16.5).  U/A remarkable  for amber color, cloudy and large hgb, protein and trace leukocytes.  CT A/P w/o contrast found 5x5x6 mm left UPJ stone with mild left hydronephrosis and 1-2 mm nonobstructing stone in lower pole of left kidney.  Also noted 8mm lesion in posterior lower pole of L kidney, radiologist recommends  MRI for further evaluation.  Pt given morphine/zofran for pain and nausea control in ED with improvement in both, also given percocet prior to discharge. Given improvement in pain and nausea during ED, tolerance of PO intake, no signs of UTI or pyelo, normal creatinine, stone size < 62mm and reliable follow up, patient is safe to discharge today.  Prescribed pt short course of percocet for pain control, encouraged increase water intake and close follow up with [facility].  Discussed CT scan findings and dispo plan with pt and husband. ED return precautions given.  Pt and husband verbalized understanding.    Final Clinical Impressions(s) / ED Diagnoses   Final diagnoses:  Kidney stone    New Prescriptions Discharge Medication List as of 09/10/2016 12:45 PM    START taking these medications   Details  ondansetron (ZOFRAN) 4 MG tablet Take 1 tablet (4 mg total) by mouth every 6 (six) hours., Starting Mon 09/10/2016, Print    oxyCODONE-acetaminophen (PERCOCET/ROXICET) 5-325 MG tablet Take 2 tablets by mouth every 4 (four) hours as needed for severe pain., Starting Mon 09/10/2016, Print         Liberty Handy, PA-C 09/10/16 2307    Melene Plan, DO 09/11/16 [id number]

## 2016-09-10 NOTE — ED Triage Notes (Signed)
Pt reports that yesterday she started to have left side flank pain that was sharp in nature with associated nausea. Pt reports that she also has urinary urgency that started on Saturday. Pt denies any hematuria or hx of kidney stones.

## 2016-09-19 ENCOUNTER — Other Ambulatory Visit: Payer: Self-pay | Admitting: Urology

## 2016-09-19 ENCOUNTER — Telehealth: Payer: Self-pay | Admitting: Cardiology

## 2016-09-19 NOTE — Telephone Encounter (Signed)
Did not need encounter °

## 2016-09-20 ENCOUNTER — Telehealth: Payer: Self-pay | Admitting: *Deleted

## 2016-09-20 NOTE — Telephone Encounter (Signed)
Patient is scheduled for extracorporeal shock wave lithotripsy 09/24/16. Okay for lithotripsy per dr [name] faxed to the number provided.

## 2016-09-21 ENCOUNTER — Other Ambulatory Visit: Payer: Self-pay | Admitting: Urology

## 2016-09-24 ENCOUNTER — Ambulatory Visit (HOSPITAL_COMMUNITY): Admission: RE | Admit: 2016-09-24 | Payer: 59 | Source: Ambulatory Visit | Admitting: Urology

## 2016-09-24 ENCOUNTER — Encounter (HOSPITAL_COMMUNITY): Admission: RE | Payer: Self-pay | Source: Ambulatory Visit

## 2016-09-24 SURGERY — LITHOTRIPSY, ESWL
Anesthesia: LOCAL | Laterality: Left

## 2016-11-01 ENCOUNTER — Encounter: Payer: Self-pay | Admitting: Cardiology

## 2016-12-10 ENCOUNTER — Other Ambulatory Visit: Payer: Self-pay | Admitting: Urology

## 2016-12-14 ENCOUNTER — Encounter (HOSPITAL_COMMUNITY): Payer: Self-pay

## 2016-12-14 ENCOUNTER — Encounter (HOSPITAL_COMMUNITY)
Admission: RE | Admit: 2016-12-14 | Discharge: 2016-12-14 | Disposition: A | Discharge status: Home / Self Care | Payer: 59 | Source: Ambulatory Visit | Attending: Urology | Admitting: Urology

## 2016-12-14 DIAGNOSIS — Z87442 Personal history of urinary calculi: Secondary | ICD-10-CM | POA: Diagnosis not present

## 2016-12-14 DIAGNOSIS — N132 Hydronephrosis with renal and ureteral calculous obstruction: Secondary | ICD-10-CM | POA: Diagnosis not present

## 2016-12-14 DIAGNOSIS — I509 Heart failure, unspecified: Secondary | ICD-10-CM | POA: Diagnosis not present

## 2016-12-14 DIAGNOSIS — Z880 Allergy status to penicillin: Secondary | ICD-10-CM | POA: Diagnosis not present

## 2016-12-14 DIAGNOSIS — K219 Gastro-esophageal reflux disease without esophagitis: Secondary | ICD-10-CM | POA: Diagnosis not present

## 2016-12-14 DIAGNOSIS — I110 Hypertensive heart disease with heart failure: Secondary | ICD-10-CM | POA: Diagnosis not present

## 2016-12-14 DIAGNOSIS — Z800 Family history of malignant neoplasm of digestive organs: Secondary | ICD-10-CM | POA: Diagnosis not present

## 2016-12-14 DIAGNOSIS — Z881 Allergy status to other antibiotic agents status: Secondary | ICD-10-CM | POA: Diagnosis not present

## 2016-12-14 DIAGNOSIS — Z8059 Family history of malignant neoplasm of other urinary tract organ: Secondary | ICD-10-CM | POA: Diagnosis not present

## 2016-12-14 DIAGNOSIS — Z8249 Family history of ischemic heart disease and other diseases of the circulatory system: Secondary | ICD-10-CM | POA: Diagnosis not present

## 2016-12-14 DIAGNOSIS — Z9851 Tubal ligation status: Secondary | ICD-10-CM | POA: Diagnosis not present

## 2016-12-14 DIAGNOSIS — I420 Dilated cardiomyopathy: Secondary | ICD-10-CM | POA: Diagnosis not present

## 2016-12-14 DIAGNOSIS — Z886 Allergy status to analgesic agent status: Secondary | ICD-10-CM | POA: Diagnosis not present

## 2016-12-14 DIAGNOSIS — Z9889 Other specified postprocedural states: Secondary | ICD-10-CM | POA: Diagnosis not present

## 2016-12-14 DIAGNOSIS — F329 Major depressive disorder, single episode, unspecified: Secondary | ICD-10-CM | POA: Diagnosis not present

## 2016-12-14 HISTORY — DX: Nausea with vomiting, unspecified: R11.2

## 2016-12-14 HISTORY — DX: Adverse effect of unspecified anesthetic, initial encounter: T41.45XA

## 2016-12-14 HISTORY — DX: Calculus of ureter: N20.1

## 2016-12-14 HISTORY — DX: Other complications of anesthesia, initial encounter: T88.59XA

## 2016-12-14 HISTORY — DX: Other specified postprocedural states: Z98.890

## 2016-12-14 LAB — CBC
HEMATOCRIT: 36.8 % (ref 36.0–46.0)
Hemoglobin: 12.8 g/dL (ref 12.0–15.0)
MCH: 31 pg (ref 26.0–34.0)
MCHC: 34.8 g/dL (ref 30.0–36.0)
MCV: 89.1 fL (ref 78.0–100.0)
PLATELETS: 188 10*3/uL (ref 150–400)
RBC: 4.13 MIL/uL (ref 3.87–5.11)
RDW: 12.7 % (ref 11.5–15.5)
WBC: 6.8 10*3/uL (ref 4.0–10.5)

## 2016-12-14 LAB — BASIC METABOLIC PANEL
Anion gap: 6 (ref 5–15)
BUN: 14 mg/dL (ref 6–20)
CHLORIDE: 109 mmol/L (ref 101–111)
CO2: 24 mmol/L (ref 22–32)
CREATININE: 0.87 mg/dL (ref 0.44–1.00)
Calcium: 9.6 mg/dL (ref 8.9–10.3)
GFR calc Af Amer: >60 mL/min (ref >60)
GFR calc non Af Amer: >60 mL/min (ref >60)
Glucose, Bld: 92 mg/dL (ref 65–99)
POTASSIUM: 4.5 mmol/L (ref 3.5–5.1)
Sodium: 139 mmol/L (ref 135–145)

## 2016-12-14 LAB — HCG, SERUM, QUALITATIVE: Preg, Serum: NEGATIVE

## 2016-12-14 NOTE — Patient Instructions (Addendum)
Krystal Bradley  12/14/2016   Your procedure is scheduled on: 12/17/16  Report to [facility] Main  Entrance take [facility]  elevators to 3rd floor to  Short Stay Center at 5:30 AM.  Call this number if you have problems the morning of surgery [phone]   Remember: ONLY 1 PERSON MAY GO WITH YOU TO SHORT STAY TO GET  READY MORNING OF YOUR SURGERY.  Do not eat food or drink liquids :After Midnight.     Take these medicines the morning of surgery with A SIP OF WATER: Carvedilol, Lorazepam if needed, oxycodone-acetaminophen if needed.                               You may not have any metal on your body including hair pins and              piercings  Do not wear jewelry, make-up, lotions, powders or perfumes, deodorant             Do not wear nail polish.  Do not shave  48 hours prior to surgery.              Men may shave face and neck.   Do not bring valuables to the hospital. Krystal Bradley IS NOT             RESPONSIBLE   FOR VALUABLES.  Contacts, dentures or bridgework may not be worn into surgery.  Leave suitcase in the car. After surgery it may be brought to your room.     Patients discharged the day of surgery will not be allowed to drive home.  Name and phone number of your driver: Krystal Bradley (husband) [phone]               Please read over the following fact sheets you were given: _____________________________________________________________________             [facility] - Preparing for Surgery Before surgery, you can play an important role.  Because skin is not sterile, your skin needs to be as free of germs as possible.  You can reduce the number of germs on your skin by washing with CHG (chlorahexidine gluconate) soap before surgery.  CHG is an antiseptic cleaner which kills germs and bonds with the skin to continue killing germs even after washing. Please DO NOT use if you have an allergy to CHG or antibacterial soaps.  If your skin becomes  reddened/irritated stop using the CHG and inform your nurse when you arrive at Short Stay. Do not shave (including legs and underarms) for at least 48 hours prior to the first CHG shower.  You may shave your face/neck. Please follow these instructions carefully:  1.  Shower with CHG Soap the night before surgery and the  morning of Surgery.  2.  If you choose to wash your hair, wash your hair first as usual with your  normal  shampoo.  3.  After you shampoo, rinse your hair and body thoroughly to remove the  shampoo.                           4.  Use CHG as you would any other liquid soap.  You can apply chg directly  to the skin and wash  Gently with a scrungie or clean washcloth.  5.  Apply the CHG Soap to your body ONLY FROM THE NECK DOWN.   Do not use on face/ open                           Wound or open sores. Avoid contact with eyes, ears mouth and genitals (private parts).                       Wash face,  Genitals (private parts) with your normal soap.             6.  Wash thoroughly, paying special attention to the area where your surgery  will be performed.  7.  Thoroughly rinse your body with warm water from the neck down.  8.  DO NOT shower/wash with your normal soap after using and rinsing off  the CHG Soap.                9.  Pat yourself dry with a clean towel.            10.  Wear clean pajamas.            11.  Place clean sheets on your bed the night of your first shower and do not  sleep with pets. Day of Surgery : Do not apply any lotions/deodorants the morning of surgery.  Please wear clean clothes to the hospital/surgery center.  FAILURE TO FOLLOW THESE INSTRUCTIONS MAY RESULT IN THE CANCELLATION OF YOUR SURGERY PATIENT SIGNATURE_________________________________  NURSE SIGNATURE__________________________________  ________________________________________________________________________

## 2016-12-14 NOTE — Pre-Procedure Instructions (Signed)
03-16-16 CT Chest, Echo (in epic) 03-01-16 EKG (in epic)

## 2016-12-16 NOTE — H&P (Signed)
H&P  Chief Complaint: Kidney stone  History of Present Illness: Krystal Bradley is a 56 y.o. year old female who presents for ureteroscopic mgmt of a persistent left ureteral calculus. She has had a persistent symptomatic stone despite undergoing MET.  Past Medical History:  Diagnosis Date  . CHF (congestive heart failure) ([place])   . Complication of anesthesia   . Depression   . Dilated cardiomyopathy [facility]) dx Feb 2011   02/10/10 echo EF 20%, 11/09/10 echo 45%  . GERD (gastroesophageal reflux disease)   . Herpes   . Migraines   . PONV (postoperative nausea and vomiting)   . Ureteral stone     Past Surgical History:  Procedure Laterality Date  . CARDIAC CATHETERIZATION    . CESAREAN SECTION    . Cold knife conization    . DILATION AND CURETTAGE OF UTERUS    . ECTOPIC PREGNANCY SURGERY    . Mirena     Inserted 10-2009  . Tonsillectomy    . TUBAL LIGATION      Home Medications:  No prescriptions prior to admission.    Allergies:  Allergies  Allergen Reactions  . Ibuprofen Hives  . Penicillins Hives and Other (See Comments)    Has patient had a PCN reaction causing immediate rash, facial/tongue/throat swelling, SOB or lightheadedness with hypotension: No Has patient had a PCN reaction causing severe rash involving mucus membranes or skin necrosis: No Has patient had a PCN reaction that required hospitalization No Has patient had a PCN reaction occurring within the last 10 years: No If all of the above answers are "NO", then may proceed with Cephalosporin use.  . Cephalosporins Hives    Family History  Problem Relation Age of Onset  . Cancer Mother     Ureter  . Cancer Father     Colon,Gallbladder  . Heart disease Father     Atrial fibrillation  . Cancer Brother     Colon    Social History:  reports that she has never smoked. She has never used smokeless tobacco. She reports that she drinks alcohol. She reports that she does not use drugs.  ROS: A complete review  of systems was performed.  All systems are negative except for pertinent findings as noted.  Physical Exam:  Vital signs in last 24 hours:   General:  Alert and oriented, No acute distress HEENT: Normocephalic, atraumatic Neck: No JVD or lymphadenopathy Cardiovascular: Regular rate and rhythm Lungs: Clear bilaterally Abdomen: Soft, nontender, nondistended, no abdominal masses Back: No CVA tenderness Extremities: No edema Neurologic: Grossly intact  Laboratory Data:  No results found for this or any previous visit (from the past 24 hour(s)). No results found for this or any previous visit (from the past 240 hour(s)). Creatinine:  Recent Labs  12/14/16 1108  CREATININE 0.87    Radiologic Imaging: No results found.  Impression/Assessment:  Left ureteral stone  Plan:  Cysto, left ureteroscopy, HLL and extraction of stone, possible left J2 stent placement  Irving Bloor M 12/16/2016, 7:59 PM  Lillette Boxer. Natayla Cadenhead MD

## 2016-12-17 ENCOUNTER — Encounter (HOSPITAL_COMMUNITY): Payer: Self-pay | Admitting: *Deleted

## 2016-12-17 ENCOUNTER — Ambulatory Visit (HOSPITAL_COMMUNITY): Payer: 59 | Admitting: Certified Registered Nurse Anesthetist

## 2016-12-17 ENCOUNTER — Encounter (HOSPITAL_COMMUNITY)
Admission: RE | Disposition: A | Discharge status: Home / Self Care | Payer: Self-pay | Source: Ambulatory Visit | Attending: Urology

## 2016-12-17 ENCOUNTER — Ambulatory Visit (HOSPITAL_COMMUNITY)
Admission: RE | Admit: 2016-12-17 | Discharge: 2016-12-17 | Disposition: A | Discharge status: Home / Self Care | Payer: 59 | Source: Ambulatory Visit | Attending: Urology | Admitting: Urology

## 2016-12-17 ENCOUNTER — Emergency Department (HOSPITAL_COMMUNITY)
Admission: EM | Admit: 2016-12-17 | Discharge: 2016-12-17 | Disposition: A | Discharge status: Home / Self Care | Payer: 59 | Source: Home / Self Care | Attending: Emergency Medicine | Admitting: Emergency Medicine

## 2016-12-17 ENCOUNTER — Ambulatory Visit (HOSPITAL_COMMUNITY): Payer: 59

## 2016-12-17 DIAGNOSIS — N132 Hydronephrosis with renal and ureteral calculous obstruction: Secondary | ICD-10-CM | POA: Insufficient documentation

## 2016-12-17 DIAGNOSIS — I420 Dilated cardiomyopathy: Secondary | ICD-10-CM | POA: Insufficient documentation

## 2016-12-17 DIAGNOSIS — F329 Major depressive disorder, single episode, unspecified: Secondary | ICD-10-CM | POA: Insufficient documentation

## 2016-12-17 DIAGNOSIS — R1012 Left upper quadrant pain: Secondary | ICD-10-CM

## 2016-12-17 DIAGNOSIS — Z800 Family history of malignant neoplasm of digestive organs: Secondary | ICD-10-CM | POA: Insufficient documentation

## 2016-12-17 DIAGNOSIS — K219 Gastro-esophageal reflux disease without esophagitis: Secondary | ICD-10-CM | POA: Insufficient documentation

## 2016-12-17 DIAGNOSIS — R1032 Left lower quadrant pain: Secondary | ICD-10-CM

## 2016-12-17 DIAGNOSIS — Z8059 Family history of malignant neoplasm of other urinary tract organ: Secondary | ICD-10-CM | POA: Insufficient documentation

## 2016-12-17 DIAGNOSIS — Z79899 Other long term (current) drug therapy: Secondary | ICD-10-CM

## 2016-12-17 DIAGNOSIS — Z8249 Family history of ischemic heart disease and other diseases of the circulatory system: Secondary | ICD-10-CM | POA: Insufficient documentation

## 2016-12-17 DIAGNOSIS — I509 Heart failure, unspecified: Secondary | ICD-10-CM | POA: Insufficient documentation

## 2016-12-17 DIAGNOSIS — Z9889 Other specified postprocedural states: Secondary | ICD-10-CM | POA: Insufficient documentation

## 2016-12-17 DIAGNOSIS — I110 Hypertensive heart disease with heart failure: Secondary | ICD-10-CM | POA: Insufficient documentation

## 2016-12-17 DIAGNOSIS — G8918 Other acute postprocedural pain: Secondary | ICD-10-CM

## 2016-12-17 DIAGNOSIS — Z881 Allergy status to other antibiotic agents status: Secondary | ICD-10-CM | POA: Insufficient documentation

## 2016-12-17 DIAGNOSIS — Z886 Allergy status to analgesic agent status: Secondary | ICD-10-CM | POA: Insufficient documentation

## 2016-12-17 DIAGNOSIS — Z9851 Tubal ligation status: Secondary | ICD-10-CM | POA: Insufficient documentation

## 2016-12-17 DIAGNOSIS — Z880 Allergy status to penicillin: Secondary | ICD-10-CM | POA: Insufficient documentation

## 2016-12-17 DIAGNOSIS — Z87442 Personal history of urinary calculi: Secondary | ICD-10-CM | POA: Insufficient documentation

## 2016-12-17 HISTORY — PX: CYSTOSCOPY/RETROGRADE/URETEROSCOPY/STONE EXTRACTION WITH BASKET: SHX5317

## 2016-12-17 LAB — CBC WITH DIFFERENTIAL/PLATELET
Basophils Absolute: 0 10*3/uL (ref 0.0–0.1)
Basophils Relative: 0 %
Eosinophils Absolute: 0 10*3/uL (ref 0.0–0.7)
Eosinophils Relative: 0 %
HEMATOCRIT: 35.9 % — AB (ref 36.0–46.0)
HEMOGLOBIN: 12.3 g/dL (ref 12.0–15.0)
LYMPHS ABS: 0.8 10*3/uL (ref 0.7–4.0)
LYMPHS PCT: 6 %
MCH: 30.1 pg (ref 26.0–34.0)
MCHC: 34.3 g/dL (ref 30.0–36.0)
MCV: 87.8 fL (ref 78.0–100.0)
MONOS PCT: 4 %
Monocytes Absolute: 0.6 10*3/uL (ref 0.1–1.0)
NEUTROS ABS: 13.2 10*3/uL — AB (ref 1.7–7.7)
Neutrophils Relative %: 90 %
Platelets: 173 10*3/uL (ref 150–400)
RBC: 4.09 MIL/uL (ref 3.87–5.11)
RDW: 12.6 % (ref 11.5–15.5)
WBC: 14.7 10*3/uL — AB (ref 4.0–10.5)

## 2016-12-17 LAB — BASIC METABOLIC PANEL
Anion gap: 6 (ref 5–15)
BUN: 20 mg/dL (ref 6–20)
CHLORIDE: 109 mmol/L (ref 101–111)
CO2: 22 mmol/L (ref 22–32)
CREATININE: 0.93 mg/dL (ref 0.44–1.00)
Calcium: 8.8 mg/dL — ABNORMAL LOW (ref 8.9–10.3)
GFR calc non Af Amer: >60 mL/min (ref >60)
Glucose, Bld: 110 mg/dL — ABNORMAL HIGH (ref 65–99)
POTASSIUM: 3.9 mmol/L (ref 3.5–5.1)
Sodium: 137 mmol/L (ref 135–145)

## 2016-12-17 SURGERY — CYSTOSCOPY, WITH CALCULUS REMOVAL USING BASKET
Anesthesia: General | Laterality: Left

## 2016-12-17 MED ORDER — PROPOFOL 10 MG/ML IV BOLUS
INTRAVENOUS | Status: AC
  Filled 2016-12-17: qty 40

## 2016-12-17 MED ORDER — MORPHINE SULFATE (PF) 4 MG/ML IV SOLN
4.0000 mg | Freq: Once | INTRAVENOUS | Status: AC
  Administered 2016-12-17: 4 mg via INTRAVENOUS
  Filled 2016-12-17: qty 1

## 2016-12-17 MED ORDER — MIDAZOLAM HCL 2 MG/2ML IJ SOLN: 0.5000 mg | Freq: Once | INTRAMUSCULAR | Status: DC | PRN

## 2016-12-17 MED ORDER — IOPAMIDOL (ISOVUE-300) INJECTION 61%
INTRAVENOUS | Status: DC | PRN
  Administered 2016-12-17: 6 mL

## 2016-12-17 MED ORDER — ONDANSETRON HCL 4 MG/2ML IJ SOLN
4.0000 mg | Freq: Once | INTRAMUSCULAR | Status: AC
  Administered 2016-12-17: 4 mg via INTRAVENOUS
  Filled 2016-12-17: qty 2

## 2016-12-17 MED ORDER — FENTANYL CITRATE (PF) 100 MCG/2ML IJ SOLN
25.0000 ug | INTRAMUSCULAR | Status: DC | PRN
  Administered 2016-12-17 (×2): 50 ug via INTRAVENOUS

## 2016-12-17 MED ORDER — PROMETHAZINE HCL 25 MG/ML IJ SOLN: 6.2500 mg | INTRAMUSCULAR | Status: DC | PRN

## 2016-12-17 MED ORDER — PROPOFOL 10 MG/ML IV BOLUS
INTRAVENOUS | Status: DC | PRN
  Administered 2016-12-17: 140 mg via INTRAVENOUS

## 2016-12-17 MED ORDER — SODIUM CHLORIDE 0.9 % IR SOLN
Status: DC | PRN
  Administered 2016-12-17: 1000 mL

## 2016-12-17 MED ORDER — SODIUM CHLORIDE 0.9 % IR SOLN
Status: DC | PRN
Start: 2016-12-17 — End: 2016-12-17
  Administered 2016-12-17: 3000 mL

## 2016-12-17 MED ORDER — EPHEDRINE 5 MG/ML INJ
INTRAVENOUS | Status: AC
  Filled 2016-12-17: qty 10

## 2016-12-17 MED ORDER — CIPROFLOXACIN IN D5W 400 MG/200ML IV SOLN
400.0000 mg | INTRAVENOUS | Status: AC
  Administered 2016-12-17: 400 mg via INTRAVENOUS

## 2016-12-17 MED ORDER — FENTANYL CITRATE (PF) 100 MCG/2ML IJ SOLN
INTRAMUSCULAR | Status: AC
  Filled 2016-12-17: qty 2

## 2016-12-17 MED ORDER — ONDANSETRON HCL 4 MG/2ML IJ SOLN
INTRAMUSCULAR | Status: DC | PRN
  Administered 2016-12-17: 4 mg via INTRAVENOUS

## 2016-12-17 MED ORDER — MIDAZOLAM HCL 2 MG/2ML IJ SOLN
INTRAMUSCULAR | Status: AC
  Filled 2016-12-17: qty 2

## 2016-12-17 MED ORDER — LACTATED RINGERS IV SOLN
INTRAVENOUS | Status: DC | PRN
  Administered 2016-12-17: 08:00:00 via INTRAVENOUS

## 2016-12-17 MED ORDER — MIDAZOLAM HCL 5 MG/5ML IJ SOLN
INTRAMUSCULAR | Status: DC | PRN
  Administered 2016-12-17: 2 mg via INTRAVENOUS

## 2016-12-17 MED ORDER — SULFAMETHOXAZOLE-TRIMETHOPRIM 800-160 MG PO TABS: 1.0000 | ORAL_TABLET | Freq: Two times a day (BID) | ORAL | 0 refills | Status: DC

## 2016-12-17 MED ORDER — EPHEDRINE SULFATE-NACL 50-0.9 MG/10ML-% IV SOSY
PREFILLED_SYRINGE | INTRAVENOUS | Status: DC | PRN
  Administered 2016-12-17: 10 mg via INTRAVENOUS

## 2016-12-17 MED ORDER — LIDOCAINE 2% (20 MG/ML) 5 ML SYRINGE
INTRAMUSCULAR | Status: DC | PRN
  Administered 2016-12-17: 60 mg via INTRAVENOUS

## 2016-12-17 MED ORDER — MEPERIDINE HCL 50 MG/ML IJ SOLN: 6.2500 mg | INTRAMUSCULAR | Status: DC | PRN

## 2016-12-17 MED ORDER — OXYCODONE-ACETAMINOPHEN 5-325 MG PO TABS: 1.0000 | ORAL_TABLET | Freq: Four times a day (QID) | ORAL | 0 refills | Status: DC | PRN

## 2016-12-17 SURGICAL SUPPLY — 23 items
BAG URO CATCHER STRL LF (MISCELLANEOUS) ×3 IMPLANT
BASKET LASER NITINOL 1.9FR (BASKET) ×1 IMPLANT
BASKET STONE 1.7 NGAGE (UROLOGICAL SUPPLIES) ×2 IMPLANT
BASKET ZERO TIP NITINOL 2.4FR (BASKET) IMPLANT
BSKT STON RTRVL 120 1.9FR (BASKET)
BSKT STON RTRVL ZERO TP 2.4FR (BASKET)
CATH INTERMIT  6FR 70CM (CATHETERS) ×2 IMPLANT
CLOTH BEACON ORANGE TIMEOUT ST (SAFETY) ×3 IMPLANT
FIBER LASER FLEXIVA 365 (UROLOGICAL SUPPLIES) IMPLANT
FIBER LASER TRAC TIP (UROLOGICAL SUPPLIES) IMPLANT
GLOVE BIOGEL M 8.0 STRL (GLOVE) ×3 IMPLANT
GOWN STRL REUS W/ TWL XL LVL3 (GOWN DISPOSABLE) ×1 IMPLANT
GOWN STRL REUS W/TWL LRG LVL3 (GOWN DISPOSABLE) ×4 IMPLANT
GOWN STRL REUS W/TWL XL LVL3 (GOWN DISPOSABLE) ×3
GUIDEWIRE ANG ZIPWIRE 038X150 (WIRE) ×1 IMPLANT
GUIDEWIRE STR DUAL SENSOR (WIRE) ×3 IMPLANT
IV NS 1000ML (IV SOLUTION) ×3
IV NS 1000ML BAXH (IV SOLUTION) ×1 IMPLANT
MANIFOLD NEPTUNE II (INSTRUMENTS) ×3 IMPLANT
PACK CYSTO (CUSTOM PROCEDURE TRAY) ×3 IMPLANT
SHEATH ACCESS URETERAL 24CM (SHEATH) ×2 IMPLANT
TUBING CONNECTING 10 (TUBING) ×2 IMPLANT
TUBING CONNECTING 10' (TUBING) ×1

## 2016-12-17 NOTE — OR Nursing (Signed)
Stone taken per Dr. Retta Diones.

## 2016-12-17 NOTE — ED Triage Notes (Signed)
Pt had surgery this morning to remove a kidney stone, presents to ER for uncontrollable pain, spoke to her surgeon who told her to come to ER. Pt also reports nausea and frequent urination.

## 2016-12-17 NOTE — ED Notes (Addendum)
Patient was alert, oriented and stable upon discharge. RN went over AVS and patient had no further questions.  Patient was instructed not to drive or operate heavy machinery on narcotic pain medication.   

## 2016-12-17 NOTE — Anesthesia Preprocedure Evaluation (Addendum)
Anesthesia Evaluation  Patient identified by MRN, date of birth, ID band Patient awake    Reviewed: Allergy & Precautions, NPO status , Patient's Chart, lab work & pertinent test results, reviewed documented beta blocker date and time   History of Anesthesia Complications (+) PONV  Airway Mallampati: II  TM Distance: >3 FB Neck ROM: Full    Dental  (+) Dental Advisory Given   Pulmonary neg pulmonary ROS,    breath sounds clear to auscultation       Cardiovascular hypertension, Pt. on medications and Pt. on home beta blockers (-) angina Rhythm:Regular Rate:Normal  '17 ECHO: EF 55%, mild MR   Neuro/Psych  Headaches, Depression    GI/Hepatic Neg liver ROS, GERD  Medicated and Controlled,  Endo/Other  negative endocrine ROS  Renal/GU stones     Musculoskeletal   Abdominal   Peds  Hematology negative hematology ROS (+)   Anesthesia Other Findings   Reproductive/Obstetrics                            Anesthesia Physical Anesthesia Plan  ASA: III  Anesthesia Plan: General   Post-op Pain Management:    Induction: Intravenous  Airway Management Planned: LMA  Additional Equipment:   Intra-op Plan:   Post-operative Plan:   Informed Consent: I have reviewed the patients History and Physical, chart, labs and discussed the procedure including the risks, benefits and alternatives for the proposed anesthesia with the patient or authorized representative who has indicated his/her understanding and acceptance.   Dental advisory given  Plan Discussed with: CRNA and Surgeon  Anesthesia Plan Comments: (Plan routine monitors, GA- LMA OK)        Anesthesia Quick Evaluation

## 2016-12-17 NOTE — Discharge Instructions (Signed)
General Anesthesia, Adult, Care After These instructions provide you with information about caring for yourself after your procedure. Your health care provider may also give you more specific instructions. Your treatment has been planned according to current medical practices, but problems sometimes occur. Call your health care provider if you have any problems or questions after your procedure. What can I expect after the procedure? After the procedure, it is common to have:  Vomiting.  A sore throat.  Mental slowness. It is common to feel:  Nauseous.  Cold or shivery.  Sleepy.  Tired.  Sore or achy, even in parts of your body where you did not have surgery. Follow these instructions at home: For at least 24 hours after the procedure:   Do not:  Participate in activities where you could fall or become injured.  Drive.  Use heavy machinery.  Drink alcohol.  Take sleeping pills or medicines that cause drowsiness.  Make important decisions or sign legal documents.  Take care of children on your own.  Rest. Eating and drinking   If you vomit, drink water, juice, or soup when you can drink without vomiting.  Drink enough fluid to keep your urine clear or pale yellow.  Make sure you have little or no nausea before eating solid foods.  Follow the diet recommended by your health care provider. General instructions   Have a responsible adult stay with you until you are awake and alert.  Return to your normal activities as told by your health care provider. Ask your health care provider what activities are safe for you.  Take over-the-counter and prescription medicines only as told by your health care provider.  If you smoke, do not smoke without supervision.  Keep all follow-up visits as told by your health care provider. This is important. Contact a health care provider if:  You continue to have nausea or vomiting at home, and medicines are not helpful.  You  cannot drink fluids or start eating again.  You cannot urinate after 8-12 hours.  You develop a skin rash.  You have fever.  You have increasing redness at the site of your procedure. Get help right away if:  You have difficulty breathing.  You have chest pain.  You have unexpected bleeding.  You feel that you are having a life-threatening or urgent problem. This information is not intended to replace advice given to you by your health care provider. Make sure you discuss any questions you have with your health care provider. Document Released: 12/31/2000 Document Revised: 02/27/2016 Document Reviewed: 09/08/2015 Elsevier Interactive Patient Education  2017 [employer] POSTOPERATIVE CARE AFTER URETEROSCOPY    Diet  Once you have adequately recovered from anesthesia, you may gradually advance your diet, as tolerated, to your regular diet.  Activities  You may gradually increase your activities to your normal unrestricted level the day following your procedure.  Medications  You should resume all preoperative medications. If you are on aspirin-like compounds, you should not resume these until the blood clears from your urine. If given an antibiotic by the surgeon, take these until they are completed. You may also be given, if you have a stent, medications to decrease the urinary frequency and urgency.  Pain  After ureteroscopy, there may be some pain on the side of the scope. Take your pain medicine for this. Usually, this pain resolves within a day or 2.  Fever  Please report any fever over 100 to the doctor.

## 2016-12-17 NOTE — Discharge Instructions (Signed)
As discussed, please follow-up with urology as scheduled. If you experience any worsening pain call the office and they will try to accommodate seeing you earlier. Drink plenty of fluids and take Percocet for pain as needed. Return to the emergency department if unable to be seen in office and symptoms worsen.

## 2016-12-17 NOTE — ED Provider Notes (Signed)
[facility]-EMERGENCY DEPT Provider Note   CSN: [id number] Arrival date & time: 12/17/16  1409     History   Chief Complaint Chief Complaint  Patient presents with  . Post-op Problem    HPI Krystal Bradley is a 56 y.o. female presenting with left-sided back pain left upper quadrant and left lower quadrant pain following a retrograde urethroscopy with stone removal discharge just morning. She describes the pain as a constant ache in her back with intermittent sharp shooting pain in her left lower quadrant. She has tried Percocet that she had at home from prior visit with no relief. She then called urology and was told to come to the emergency department. She has also been experiencing pain with urination mild hematuria which was expected, but she shouldn't been experiencing this much pain. Reports nausea but denies vomiting or any other symptoms.  HPI  Past Medical History:  Diagnosis Date  . CHF (congestive heart failure) ([facility])   . Complication of anesthesia   . Depression   . Dilated cardiomyopathy [facility]) dx Feb 2011   02/10/10 echo EF 20%, 11/09/10 echo 45%  . GERD (gastroesophageal reflux disease)   . Herpes   . Migraines   . PONV (postoperative nausea and vomiting)   . Ureteral stone     Patient Active Problem List   Diagnosis Date Noted  . Abnormal chest CT 03/01/2016  . Cardiomyopathy ([facility]) 01/30/2012  . Congestive heart failure ([facility]) 01/30/2012    Past Surgical History:  Procedure Laterality Date  . CARDIAC CATHETERIZATION    . CESAREAN SECTION    . Cold knife conization    . DILATION AND CURETTAGE OF UTERUS    . ECTOPIC PREGNANCY SURGERY    . Mirena     Inserted 10-2009  . Tonsillectomy    . TUBAL LIGATION      OB History    Gravida Para Term Preterm AB Living   5 2     3 2    SAB TAB Ectopic Multiple Live Births   2   1           Home Medications    Prior to Admission medications   Medication Sig Start Date End Date Taking? Authorizing Provider    acetaminophen (TYLENOL) 500 MG tablet Take 500-1,000 mg by mouth every 6 (six) hours as needed for mild pain or moderate pain.   Yes Historical Provider, MD  butalbital-acetaminophen-caffeine (FIORICET, ESGIC) 50-325-40 MG tablet Take 1 tablet by mouth every 6 (six) hours as needed for headache.   Yes Historical Provider, MD  carvedilol (COREG) 6.25 MG tablet Take 6.25 mg by mouth 2 (two) times daily with a meal.   Yes Historical Provider, MD  FLUoxetine (PROZAC) 20 MG capsule Take 20 mg by mouth at bedtime.   Yes Historical Provider, MD  LORazepam (ATIVAN) 0.5 MG tablet Take 0.5 mg by mouth every 8 (eight) hours as needed for anxiety.   Yes Historical Provider, MD  nortriptyline (PAMELOR) 25 MG capsule Take 1 capsule (25 mg total) by mouth at bedtime. 08/25/14  Yes Dara Lords, MD  ramipril (ALTACE) 5 MG capsule Take 5 mg by mouth at bedtime.   Yes Historical Provider, MD  tamsulosin (FLOMAX) 0.4 MG CAPS capsule Take 0.4 mg by mouth daily after supper.  12/01/16  Yes Historical Provider, MD  valACYclovir (VALTREX) 500 MG tablet Take 500 mg by mouth at bedtime.   Yes Historical Provider, MD  zolpidem (AMBIEN) 5 MG tablet Take 5 mg  by mouth at bedtime.    Yes Historical Provider, MD  zonisamide (ZONEGRAN) 100 MG capsule Take 300 mg by mouth at bedtime.   Yes Historical Provider, MD  levonorgestrel (MIRENA) 20 MCG/24HR IUD 1 each by Intrauterine route once.    Historical Provider, MD  oxyCODONE-acetaminophen (PERCOCET/ROXICET) 5-325 MG tablet Take 1 tablet by mouth every 6 (six) hours as needed for severe pain. 12/17/16   Georgiana Shore, PA-C  ranitidine (ZANTAC) 150 MG tablet Take 150 mg by mouth 2 (two) times daily as needed for heartburn.     Historical Provider, MD  sulfamethoxazole-trimethoprim (BACTRIM DS,SEPTRA DS) 800-160 MG tablet Take 1 tablet by mouth 2 (two) times daily. 12/17/16   Marcine Matar, MD    Family History Family History  Problem Relation Age of Onset  . Cancer  Mother     Ureter  . Cancer Father     Colon,Gallbladder  . Heart disease Father     Atrial fibrillation  . Cancer Brother     Colon    Social History Social History  Substance Use Topics  . Smoking status: Never Smoker  . Smokeless tobacco: Never Used  . Alcohol use 0.0 oz/week     Comment: Occasional     Allergies   Ibuprofen; Penicillins; and Cephalosporins   Review of Systems Review of Systems  Constitutional: Negative for chills and fever.  Eyes: Negative for visual disturbance.  Respiratory: Negative for cough, chest tightness and shortness of breath.   Cardiovascular: Negative for chest pain and palpitations.  Gastrointestinal: Positive for abdominal pain and nausea. Negative for abdominal distention, blood in stool and vomiting.  Genitourinary: Positive for dysuria, flank pain, hematuria and vaginal pain.  Musculoskeletal: Negative for arthralgias, back pain, neck pain and neck stiffness.  Skin: Negative for color change, pallor and rash.  Neurological: Negative for dizziness, seizures, syncope, weakness and light-headedness.  All other systems reviewed and are negative.    Physical Exam Updated Vital Signs BP 127/68   Pulse 85   Temp 97.5 F (36.4 C) (Oral)   Resp 18   Ht 5\' 4"  (1.626 m)   Wt 61.2 kg   LMP  (LMP Unknown)   SpO2 100%   BMI 23.17 kg/m   Physical Exam  Constitutional: She appears well-developed and well-nourished. No distress.  Afebrile, nontoxic-appearing, lying in bed in mild discomfort, no acute distress  HENT:  Head: Normocephalic and atraumatic.  Eyes: Conjunctivae and EOM are normal.  Neck: Normal range of motion.  Cardiovascular: Normal rate and regular rhythm.   No murmur heard. Pulmonary/Chest: Effort normal and breath sounds normal. No respiratory distress.  Abdominal: Soft. She exhibits no distension. There is tenderness. There is no rebound and no guarding.  Mild tenderness to deep palpation of left lower and upper  quadrant  Musculoskeletal: She exhibits no edema.  Neurological: She is alert.  Skin: Skin is warm and dry. She is not diaphoretic. No pallor.  Psychiatric: She has a normal mood and affect.  Nursing note and vitals reviewed.    ED Treatments / Results  Labs (all labs ordered are listed, but only abnormal results are displayed) Labs Reviewed  CBC WITH DIFFERENTIAL/PLATELET - Abnormal; Notable for the following:       Result Value   WBC 14.7 (*)    HCT 35.9 (*)    Neutro Abs 13.2 (*)    All other components within normal limits  BASIC METABOLIC PANEL - Abnormal; Notable for the following:    Glucose,  Bld 110 (*)    Calcium 8.8 (*)    All other components within normal limits    EKG  EKG Interpretation None       Radiology Dg C-arm 1-60 Min-no Report  Result Date: 12/17/2016 Fluoroscopy was utilized by the requesting physician.  No radiographic interpretation.    Procedures Procedures (including critical care time)  Medications Ordered in ED Medications  ondansetron (ZOFRAN) injection 4 mg (4 mg Intravenous Given 12/17/16 1534)  morphine 4 MG/ML injection 4 mg (4 mg Intravenous Given 12/17/16 1534)     Initial Impression / Assessment and Plan / ED Course  I have reviewed the triage vital signs and the nursing notes.  Pertinent labs & imaging results that were available during my care of the patient were reviewed by me and considered in my medical decision making (see chart for details).     Patient presenting s/p retrograde urethroscopy with stone removal procedure with uncontrolled pain. Procedure was performed this morning and she was discharged about 5 hours ago. Tenderness to palpation of LLQ, LUQ and left CVA tenderness.  Given zofran and morphine in ED -3:30 consult placed to urology - 4:00 spoke to Dr. Retta Diones Urology who performed the procedure and stated that everything had gone well without complications. Recommends toradol in ED followed by  percocet at home, but patient has hives as reaction to ibuprofen.Typical pain post procedure that should now improve. No imaging indicated. He will see her at her scheduled follow up, unless she continues to experience severe pain, in which case he will see her in office tomorrow. When reassessed, patient was reporting significant improvement in pain and nausea. Pain was managed in ED and patient was ready to go home.  Discharge home with pain management and follow up with urology.  Discussed strict return precautions. Patient was advised to return to the emergency department if experiencing any new or worsening symptoms. She understood and agreed with discharge plan. Patient was discussed with Dr.J. Lynelle Doctor who agrees with assessment and plan.  Final Clinical Impressions(s) / ED Diagnoses   Final diagnoses:  Postoperative pain    New Prescriptions Discharge Medication List as of 12/17/2016  4:16 PM       Georgiana Shore, PA-C 12/17/16 1910    Linwood Dibbles, MD 12/18/16 [phone]

## 2016-12-17 NOTE — Anesthesia Postprocedure Evaluation (Signed)
Anesthesia Post Note  Patient: Krystal Bradley  Procedure(s) Performed: Procedure(s) (LRB): CYSTOSCOPY/RETROGRADE/URETEROSCOPY/STONE EXTRACTION WITH BASKET (Left)  Patient location during evaluation: PACU Anesthesia Type: General Level of consciousness: awake and alert, oriented and patient cooperative Pain management: pain level controlled Vital Signs Assessment: post-procedure vital signs reviewed and stable Respiratory status: spontaneous breathing, nonlabored ventilation and respiratory function stable Cardiovascular status: blood pressure returned to baseline and stable Postop Assessment: no signs of nausea or vomiting Anesthetic complications: no       Last Vitals:  Vitals:   12/17/16 0910 12/17/16 0940  BP: (!) 114/58 (!) (P) 109/53  Pulse: 68 (P) 64  Resp: 16 (P) 16  Temp: 36.5 C     Last Pain:  Vitals:   12/17/16 0900  TempSrc:   PainSc: 0-No pain                 Leeanne Butters,E. Hawkin Charo

## 2016-12-17 NOTE — Anesthesia Procedure Notes (Signed)
Procedure Name: LMA Insertion Performed by: Danaiya Steadman J Pre-anesthesia Checklist: Patient identified, Emergency Drugs available, Suction available, Patient being monitored and Timeout performed Patient Re-evaluated:Patient Re-evaluated prior to inductionOxygen Delivery Method: Circle system utilized Preoxygenation: Pre-oxygenation with 100% oxygen Intubation Type: IV induction Ventilation: Mask ventilation without difficulty LMA: LMA inserted LMA Size: 4.0 Number of attempts: 1 Placement Confirmation: positive ETCO2,  CO2 detector and breath sounds checked- equal and bilateral Tube secured with: Tape Dental Injury: Teeth and Oropharynx as per pre-operative assessment        

## 2016-12-17 NOTE — Op Note (Signed)
PATIENT:  Krystal Bradley  PRE-OPERATIVE DIAGNOSIS: Left distal ureteral stone with non-passage/progressive symptoms  POST-OPERATIVE DIAGNOSIS: Same  PROCEDURE: Cystoscopy, left retrograde ureteropyelogram with fluoroscopic interpretation, left ureteroscopic stone extraction  SURGEON:  Bertram Millard. Jearline Hirschhorn, M.D.  ANESTHESIA:  General  EBL:  Minimal  DRAINS: None  LOCAL MEDICATIONS USED:  None  SPECIMEN:  Stone, to the patient's husband  INDICATION: Krystal Bradley is a 56 year old female initially presenting in December 2017 with a left upper/mid ureteral stone.  She was put on medical expulsive therapy.  She's had persistent symptoms as well as hydronephrosis on recent ultrasound.  She presents at this time for definitive management of this stone with ureteroscopy.  The procedure of ureteroscopy, possible holmium laser lithotripsy and extraction with possible stent placement was discussed with the patient, as well as the alternative of shockwave lithotripsy.  Attendant risks and complications of each were discussed.  She has chosen the former.  She desires to proceed.  Description of procedure: The patient was properly identified and marked (if applicable) in the holding area. They were then  taken to the operating room and placed on the table in a supine position. General anesthesia was then administered. Once fully anesthetized the patient was moved to the dorsolithotomy position and the genitalia and perineum were sterilely prepped and draped in standard fashion. An official timeout was then performed.   A 21 French panendoscope was advanced into the patient's bladder.  Bladder was inspected circumferentially.  There were no tumors, foreign bodies, trabeculations.  Ureteral orifices were normal in configuration and location.  Some mild inflammation around the left ureteral orifice indicative of possible nearby stone.  Retrograde ureteropyelogram was performed using 6 [place] open-ended  catheter, as well as radiographic contrast.  This revealed a significant filling defect right at the UVJ consistent with a stone.  Proximal to this, there was mild to moderate hydroureteronephrosis.  No filling defects were seen along the course of the ureter or in the pyelocalyceal system.  A guidewire was then advanced through the open-ended catheter into the upper pole calyceal system using fluoroscopic guidance.  The open-ended catheter was removed.  Ureteral orifice was dilated first with the inner core and then with the entire 12/14 ureteral access catheter.  The access catheter was removed.  A 6 French dual-lumen semirigid ureteroscope was then advanced into the left ureteral orifice quite easily.  The stone was quite large, but right at the ureterovesical junction.  It was grasped with an engage basket.  Fortuitously, the stone was easily extracted.  I then advanced the scope up to the more proximal ureter without seeing any further stones.  The ureteroscope was removed.  I then dilated the ureteral orifice with the inner core.  The 12/14 ureteral access catheter for approximately 3-4 minutes.  The access catheter as well as the sensor-tip guidewire with and removed.  The bladder was drained.  The scope was removed, and the patient was awakened and taken to the PACU in stable condition.  Patient tolerated the procedure well.   PLAN OF CARE: Discharge to home after PACU  PATIENT DISPOSITION:  PACU - hemodynamically stable.

## 2016-12-17 NOTE — Transfer of Care (Signed)
Immediate Anesthesia Transfer of Care Note  Patient: Krystal Bradley  Procedure(s) Performed: Procedure(s): CYSTOSCOPY/RETROGRADE/URETEROSCOPY/STONE EXTRACTION WITH BASKET (Left)  Patient Location: PACU  Anesthesia Type:General  Level of Consciousness: awake, alert  and oriented  Airway & Oxygen Therapy: Patient Spontanous Breathing and Patient connected to face mask oxygen  Post-op Assessment: Report given to RN and Post -op Vital signs reviewed and stable  Post vital signs: Reviewed and stable  Last Vitals:  Vitals:   12/17/16 0551  BP: 104/69  Pulse: 72  Resp: 16  Temp: 36.7 C    Last Pain:  Vitals:   12/17/16 0551  TempSrc: Oral      Patients Stated Pain Goal: 3 (12/17/16 0557)  Complications: No apparent anesthesia complications

## 2016-12-17 NOTE — Interval H&P Note (Signed)
History and Physical Interval Note:  12/17/2016 7:32 AM  Krystal Bradley  has presented today for surgery, with the diagnosis of LEFT URETERAL CALCULUS  The various methods of treatment have been discussed with the patient and family. After consideration of risks, benefits and other options for treatment, the patient has consented to  Procedure(s): CYSTOSCOPY/RETROGRADE/URETEROSCOPY/STONE EXTRACTION WITH BASKET (Left) HOLMIUM LASER APPLICATION (Left) CYSTOSCOPY WITH STENT PLACEMENT (Left) as a surgical intervention .  The patient's history has been reviewed, patient examined, no change in status, stable for surgery.  I have reviewed the patient's chart and labs.  Questions were answered to the patient's satisfaction.     Chelsea Aus

## 2016-12-18 ENCOUNTER — Encounter (HOSPITAL_COMMUNITY): Payer: Self-pay | Admitting: Urology

## 2017-03-25 ENCOUNTER — Other Ambulatory Visit: Payer: Self-pay | Admitting: Cardiology

## 2017-03-28 ENCOUNTER — Encounter: Payer: Self-pay | Admitting: Cardiology

## 2017-04-01 NOTE — Progress Notes (Signed)
HPI: FU cardiomyopathy and congestive heart failure. Echocardiogram in February of 2011 showed an ejection fraction of 20-25%, mild biatrial enlargement, mild mitral regurgitation and moderate tricuspid regurgitation. There is a small pericardial effusion. Cardiac catheterization in 2011 showed an ejection fraction of 15% and normal coronary arteries. Patient was treated medically. She was seen at [facility] for treatment of her heart failure and fu echo showed some improvement of LV function. Echocardiogram June 2017 showed normal LV function, grade 1 diastolic dysfunction, mild mitral regurgitation and mild tricuspid regurgitation. Abdominal CT December 2017 showed exophytic lesion left kidney and MRI with and without contrast recommended. Since I last saw her, she has mild dyspnea with more vigorous activities. No chest pain, palpitations or syncope. Occasional pain in her left knee area.  Current Outpatient Prescriptions  Medication Sig Dispense Refill  . acetaminophen (TYLENOL) 500 MG tablet Take 500-1,000 mg by mouth every 6 (six) hours as needed for mild pain or moderate pain.    . butalbital-acetaminophen-caffeine (FIORICET, ESGIC) 50-325-40 MG tablet Take 1 tablet by mouth every 6 (six) hours as needed for headache.    . carvedilol (COREG) 6.25 MG tablet Take 6.25 mg by mouth 2 (two) times daily with a meal.    . FLUoxetine (PROZAC) 20 MG capsule Take 20 mg by mouth at bedtime.    Marland Kitchen levonorgestrel (MIRENA) 20 MCG/24HR IUD 1 each by Intrauterine route once.    Marland Kitchen LORazepam (ATIVAN) 0.5 MG tablet Take 0.5 mg by mouth every 8 (eight) hours as needed for anxiety.    . nortriptyline (PAMELOR) 25 MG capsule Take 1 capsule (25 mg total) by mouth at bedtime. 30 capsule 2  . oxyCODONE-acetaminophen (PERCOCET/ROXICET) 5-325 MG tablet Take 1 tablet by mouth every 6 (six) hours as needed for severe pain. 12 tablet 0  . ramipril (ALTACE) 5 MG capsule Take 5 mg by mouth at bedtime.    .  ranitidine (ZANTAC) 150 MG tablet Take 150 mg by mouth 2 (two) times daily as needed for heartburn.     . valACYclovir (VALTREX) 500 MG tablet Take 500 mg by mouth at bedtime.    . Vitamin D, Ergocalciferol, (DRISDOL) 50000 units CAPS capsule Take 1 capsule by mouth once a week.    . zolpidem (AMBIEN) 5 MG tablet Take 5 mg by mouth at bedtime.     Marland Kitchen zonisamide (ZONEGRAN) 100 MG capsule Take 300 mg by mouth at bedtime.     No current facility-administered medications for this visit.      Past Medical History:  Diagnosis Date  . CHF (congestive heart failure) (HCC)   . Complication of anesthesia   . Depression   . Dilated cardiomyopathy [facility]) dx Feb 2011   02/10/10 echo EF 20%, 11/09/10 echo 45%  . GERD (gastroesophageal reflux disease)   . Herpes   . Migraines   . PONV (postoperative nausea and vomiting)   . Ureteral stone     Past Surgical History:  Procedure Laterality Date  . CARDIAC CATHETERIZATION    . CESAREAN SECTION    . Cold knife conization    . CYSTOSCOPY/RETROGRADE/URETEROSCOPY/STONE EXTRACTION WITH BASKET Left 12/17/2016   Procedure: CYSTOSCOPY/RETROGRADE/URETEROSCOPY/STONE EXTRACTION WITH BASKET;  Surgeon: Marcine Matar, MD;  Location: [facility];  Service: Urology;  Laterality: Left;  . DILATION AND CURETTAGE OF UTERUS    . ECTOPIC PREGNANCY SURGERY    . Mirena     Inserted 10-2009  . Tonsillectomy    . TUBAL LIGATION  Social History   Social History  . Marital status: Married    Spouse name: N/A  . Number of children: 2  . Years of education: N/A   Occupational History  .  [employer]   Social History Main Topics  . Smoking status: Never Smoker  . Smokeless tobacco: Never Used  . Alcohol use 0.0 oz/week     Comment: Occasional  . Drug use: No  . Sexual activity: Yes    Birth control/ protection: IUD, Surgical     Comment: Inserted 10-2009  BTL   Other Topics Concern  . Not on file   Social History Narrative   She is  married and has 2 children.  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    Family History  Problem Relation Age of Onset  . Cancer Mother        Ureter  . Cancer Father        Colon,Gallbladder  . Heart disease Father        Atrial fibrillation  . Cancer Brother        Colon    ROS: no fevers or chills, productive cough, hemoptysis, dysphasia, odynophagia, melena, hematochezia, dysuria, hematuria, rash, seizure activity, orthopnea, PND, pedal edema, claudication. Remaining systems are negative.  Physical Exam: Well-developed well-nourished in no acute distress.  Skin is warm and dry.  HEENT is normal.  Neck is supple.  Chest is clear to auscultation with normal expansion.  Cardiovascular exam is regular rate and rhythm.  Abdominal exam nontender or distended. No masses palpated. Extremities show no edema. 2+ dorsalis pedis pulses bilaterally.  neuro grossly intact  ECG- normal sinus rhythm at a rate of 75. Nonspecific ST changes. personally reviewed  A/P  1 Cardiomyopathy-LV function improved on most recent echocardiogram. Continue ACE inhibitor and beta blocker.  2 chronic combined systolic/diastolic congestive heart failure-she will continue Lasix as needed.   3 left kidney mass on abdominal noted on abdominal CT 12/17-I have asked her to contact her urologist office as she may need an MRI to further assess.  4 Previous chest CT showing small nodule-follow-up study showed no nodule.   Olga Millers, MD

## 2017-04-15 ENCOUNTER — Encounter: Payer: Self-pay | Admitting: Cardiology

## 2017-04-15 ENCOUNTER — Ambulatory Visit (INDEPENDENT_AMBULATORY_CARE_PROVIDER_SITE_OTHER): Payer: 59 | Admitting: Cardiology

## 2017-04-15 VITALS — BP 92/70 | HR 75 | Ht 64.0 in | Wt 136.0 lb

## 2017-04-15 DIAGNOSIS — I420 Dilated cardiomyopathy: Secondary | ICD-10-CM | POA: Diagnosis not present

## 2017-04-15 DIAGNOSIS — I428 Other cardiomyopathies: Secondary | ICD-10-CM

## 2017-04-15 NOTE — Patient Instructions (Signed)
Your physician wants you to follow-up in: ONE YEAR WITH DR CRENSHAW You will receive a reminder letter in the mail two months in advance. If you don't receive a letter, please call our office to schedule the follow-up appointment.   If you need a refill on your cardiac medications before your next appointment, please call your pharmacy.  

## 2017-04-16 ENCOUNTER — Encounter: Payer: Self-pay | Admitting: Cardiology

## 2017-04-25 ENCOUNTER — Telehealth: Payer: Self-pay | Admitting: Cardiology

## 2017-04-25 NOTE — Telephone Encounter (Signed)
Pt notified of Dr's message she states that she will track sx, BP and HR. Pt will call back when needed

## 2017-04-25 NOTE — Telephone Encounter (Signed)
If symptoms persist, would arrange event monitor. Olga Millers

## 2017-04-25 NOTE — Telephone Encounter (Signed)
Returned call to patient, reports she has been having intermittent palpitations x 1 week.   Reports she doesn't think it is related to her anxiety.  She reports taking ativan every morning and this doesn't help the palpitations.  Denies SOB, CP, dizziness.   Reports episodes last a couple of hours, occurring daily.   Has not taken her BP and HR but can take at work.  Advised that this information would be useful.  Patient states she can take at work and call back.     Patient returned call with readings: 120/82 HR 82.   Advised I would seek recommendations from Dr. [name].    Patient aware and verbalized understanding.

## 2017-04-25 NOTE — Telephone Encounter (Signed)
Patient calling, states that she has had an accelerated heart rate for about 5 or 6 days and she does not believe it is anxiety-related. Patient states that she takes medication for an anxiety and would like to know if she needs to increase medications or what she should do.

## 2017-06-01 ENCOUNTER — Other Ambulatory Visit: Payer: Self-pay | Admitting: Cardiology

## 2017-06-03 NOTE — Telephone Encounter (Signed)
REFILL 

## 2017-06-29 ENCOUNTER — Other Ambulatory Visit: Payer: Self-pay | Admitting: Cardiology

## 2018-03-05 ENCOUNTER — Telehealth: Payer: Self-pay | Admitting: *Deleted

## 2018-03-05 ENCOUNTER — Encounter: Payer: Self-pay | Admitting: *Deleted

## 2018-03-05 ENCOUNTER — Encounter: Payer: Self-pay | Admitting: Cardiology

## 2018-03-05 DIAGNOSIS — R601 Generalized edema: Secondary | ICD-10-CM

## 2018-03-05 MED ORDER — FUROSEMIDE 20 MG PO TABS: ORAL_TABLET | ORAL | 3 refills | Status: DC

## 2018-03-05 NOTE — Progress Notes (Signed)
This encounter was created in error - please disregard.

## 2018-03-05 NOTE — Telephone Encounter (Signed)
    Lasix 20 mg daily as needed; bmet 2 weeks  Olga Millers                                    ---- Message -----  From: Teola Bradley  Sent: 03/05/2018  2:28 PM  To: [address] Triage  Subject: Non-Urgent Medical Question             ----- Message from Mychart, Generic sent at 03/05/2018 2:28 PM EDT -----    I would like to go on a diuretic again. I have unexplained weight gain and bloating and I also have pain in my knees and fingers. I would like to see if this might help as it previously did. I don't have shortness of breath except in extreme heat ex: mowing the yard. Thank you

## 2018-03-27 ENCOUNTER — Other Ambulatory Visit: Payer: Self-pay | Admitting: Cardiology

## 2018-03-28 NOTE — Telephone Encounter (Signed)
REFILL 

## 2018-04-08 NOTE — Progress Notes (Signed)
HPI: FU cardiomyopathy and congestive heart failure. Echocardiogram in February of 2011 showed an ejection fraction of 20-25%, mild biatrial enlargement, mild mitral regurgitation and moderate tricuspid regurgitation. There is a small pericardial effusion. Cardiac catheterization in 2011 showed an ejection fraction of 15% and normal coronary arteries. Patient was treated medically. She was seen at [facility] for treatment of her heart failure and fu echo showed some improvement of LV function. Echocardiogram June 2017 showed normal LV function, grade 1 diastolic dysfunction, mild mitral regurgitation and mild tricuspid regurgitation.  Outside laboratories personally reviewed.  July 2019 creatinine 1.01, potassium 4.8, sodium 142.  Since I last saw her,  she does note mild increased dyspnea on exertion with more vigorous activities.  She has taken additional Lasix occasionally.  She denies orthopnea, PND, pedal edema, chest pain or syncope.  Current Outpatient Medications  Medication Sig Dispense Refill  . acetaminophen (TYLENOL) 500 MG tablet Take 500-1,000 mg by mouth every 6 (six) hours as needed for mild pain or moderate pain.    . butalbital-acetaminophen-caffeine (FIORICET, ESGIC) 50-325-40 MG tablet Take 1 tablet by mouth every 6 (six) hours as needed for headache.    . carvedilol (COREG) 6.25 MG tablet TAKE 1 TABLET BY MOUTH TWICE DAILY WITH A MEAL 180 tablet 0  . FLUoxetine (PROZAC) 20 MG capsule Take 20 mg by mouth at bedtime.    . furosemide (LASIX) 20 MG tablet 1 tablet daily as needed for swelling or shortness of breath 90 tablet 3  . levonorgestrel (MIRENA) 20 MCG/24HR IUD 1 each by Intrauterine route once.    Marland Kitchen LORazepam (ATIVAN) 0.5 MG tablet Take 0.5 mg by mouth every 8 (eight) hours as needed for anxiety.    . nortriptyline (PAMELOR) 25 MG capsule Take 1 capsule (25 mg total) by mouth at bedtime. 30 capsule 2  . oxyCODONE-acetaminophen (PERCOCET/ROXICET) 5-325 MG tablet  Take 1 tablet by mouth every 6 (six) hours as needed for severe pain. 12 tablet 0  . ramipril (ALTACE) 5 MG capsule TAKE 1 CAPSULE BY MOUTH EVERY DAY 30 capsule 11  . ranitidine (ZANTAC) 150 MG tablet Take 150 mg by mouth 2 (two) times daily as needed for heartburn.     . valACYclovir (VALTREX) 500 MG tablet Take 500 mg by mouth at bedtime.    . Vitamin D, Ergocalciferol, (DRISDOL) 50000 units CAPS capsule Take 1 capsule by mouth once a week.    . zolpidem (AMBIEN CR) 6.25 MG CR tablet Take 6.25 mg by mouth at bedtime as needed for sleep.    Marland Kitchen zonisamide (ZONEGRAN) 100 MG capsule Take 300 mg by mouth at bedtime.     No current facility-administered medications for this visit.      Past Medical History:  Diagnosis Date  . CHF (congestive heart failure) (HCC)   . Complication of anesthesia   . Depression   . Dilated cardiomyopathy [facility]) dx Feb 2011   02/10/10 echo EF 20%, 11/09/10 echo 45%  . GERD (gastroesophageal reflux disease)   . Herpes   . Migraines   . PONV (postoperative nausea and vomiting)   . Ureteral stone     Past Surgical History:  Procedure Laterality Date  . CARDIAC CATHETERIZATION    . CESAREAN SECTION    . Cold knife conization    . CYSTOSCOPY/RETROGRADE/URETEROSCOPY/STONE EXTRACTION WITH BASKET Left 12/17/2016   Procedure: CYSTOSCOPY/RETROGRADE/URETEROSCOPY/STONE EXTRACTION WITH BASKET;  Surgeon: Marcine Matar, MD;  Location: [facility];  Service: Urology;  Laterality: Left;  .  DILATION AND CURETTAGE OF UTERUS    . ECTOPIC PREGNANCY SURGERY    . Mirena     Inserted 10-2009  . Tonsillectomy    . TUBAL LIGATION      Social History   Socioeconomic History  . Marital status: Married    Spouse name: Not on file  . Number of children: 2  . Years of education: Not on file  . Highest education level: Not on file  Occupational History    Employer: [employer]  Social Needs  . Financial resource strain: Not on file  . Food insecurity:     Worry: Not on file    Inability: Not on file  . Transportation needs:    Medical: Not on file    Non-medical: Not on file  Tobacco Use  . Smoking status: Never Smoker  . Smokeless tobacco: Never Used  Substance and Sexual Activity  . Alcohol use: Yes    Alcohol/week: 0.0 oz    Comment: Occasional  . Drug use: No  . Sexual activity: Yes    Birth control/protection: IUD, Surgical    Comment: Inserted 10-2009  BTL  Lifestyle  . Physical activity:    Days per week: Not on file    Minutes per session: Not on file  . Stress: Not on file  Relationships  . Social connections:    Talks on phone: Not on file    Gets together: Not on file    Attends religious service: Not on file    Active member of club or organization: Not on file    Attends meetings of clubs or organizations: Not on file    Relationship status: Not on file  . Intimate partner violence:    Fear of current or ex partner: Not on file    Emotionally abused: Not on file    Physically abused: Not on file    Forced sexual activity: Not on file  Other Topics Concern  . Not on file  Social History Narrative   She is married and has 2 children.  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    Family History  Problem Relation Age of Onset  . Cancer Mother        Ureter  . Cancer Father        Colon,Gallbladder  . Heart disease Father        Atrial fibrillation  . Cancer Brother        Colon    ROS: Some pain in right rib area and fatigue but no fevers or chills, productive cough, hemoptysis, dysphasia, odynophagia, melena, hematochezia, dysuria, hematuria, rash, seizure activity, orthopnea, PND, pedal edema, claudication. Remaining systems are negative.  Physical Exam: Well-developed well-nourished in no acute distress.  Skin is warm and dry.  HEENT is normal.  Neck is supple.  Chest is clear to auscultation with normal expansion.  Cardiovascular exam is regular rate and rhythm.  Abdominal exam  nontender or distended. No masses palpated. Extremities show no edema. neuro grossly intact  ECG-sinus rhythm at a rate of 78.  Nonspecific ST changes.  Personally reviewed  A/P  1 cardiomyopathy-LV function normalized on most recent echocardiogram.  Continue medical therapy including ACE inhibitor and beta-blocker.  She is describing some increased dyspnea on exertion.  Will repeat echocardiogram.  2 chronic combined systolic/diastolic congestive heart failure-patient remains euvolemic.  Plan to continue Lasix as needed.  3 prior abdominal CT showing left kidney mass-patient followed up with  urology concerning this issue and apparently it was felt to be a renal cyst.  Olga Millers, MD

## 2018-04-16 ENCOUNTER — Ambulatory Visit: Payer: 59 | Admitting: Cardiology

## 2018-04-16 ENCOUNTER — Encounter: Payer: Self-pay | Admitting: Cardiology

## 2018-04-16 VITALS — BP 96/72 | HR 78 | Ht 64.0 in | Wt 142.0 lb

## 2018-04-16 DIAGNOSIS — I428 Other cardiomyopathies: Secondary | ICD-10-CM | POA: Diagnosis not present

## 2018-04-16 DIAGNOSIS — R0609 Other forms of dyspnea: Secondary | ICD-10-CM

## 2018-04-16 DIAGNOSIS — I5040 Unspecified combined systolic (congestive) and diastolic (congestive) heart failure: Secondary | ICD-10-CM

## 2018-04-16 NOTE — Patient Instructions (Signed)

## 2018-04-23 ENCOUNTER — Encounter: Payer: Self-pay | Admitting: Cardiology

## 2018-04-24 ENCOUNTER — Ambulatory Visit
Admission: RE | Admit: 2018-04-24 | Discharge: 2018-04-24 | Disposition: A | Discharge status: Home / Self Care | Payer: 59 | Source: Ambulatory Visit | Attending: Obstetrics and Gynecology | Admitting: Obstetrics and Gynecology

## 2018-04-24 ENCOUNTER — Other Ambulatory Visit: Payer: Self-pay | Admitting: Obstetrics and Gynecology

## 2018-04-24 DIAGNOSIS — R0781 Pleurodynia: Secondary | ICD-10-CM

## 2018-05-13 ENCOUNTER — Other Ambulatory Visit: Payer: Self-pay

## 2018-05-13 ENCOUNTER — Ambulatory Visit (HOSPITAL_COMMUNITY): Payer: 59 | Attending: Cardiology

## 2018-05-13 ENCOUNTER — Encounter: Payer: Self-pay | Admitting: Cardiology

## 2018-05-13 DIAGNOSIS — I517 Cardiomegaly: Secondary | ICD-10-CM | POA: Insufficient documentation

## 2018-05-13 DIAGNOSIS — I428 Other cardiomyopathies: Secondary | ICD-10-CM | POA: Diagnosis not present

## 2018-05-13 DIAGNOSIS — I509 Heart failure, unspecified: Secondary | ICD-10-CM | POA: Insufficient documentation

## 2018-05-26 ENCOUNTER — Other Ambulatory Visit: Payer: Self-pay | Admitting: Cardiology

## 2018-06-23 ENCOUNTER — Other Ambulatory Visit: Payer: Self-pay | Admitting: Cardiology

## 2019-04-06 ENCOUNTER — Telehealth: Payer: Self-pay | Admitting: *Deleted

## 2019-04-06 NOTE — Telephone Encounter (Signed)
A message was left, re: follow up visit. 

## 2019-06-02 ENCOUNTER — Other Ambulatory Visit: Payer: Self-pay | Admitting: Physician Assistant

## 2019-06-02 ENCOUNTER — Other Ambulatory Visit: Payer: Self-pay | Admitting: Cardiology

## 2019-06-02 DIAGNOSIS — L980 Pyogenic granuloma: Secondary | ICD-10-CM | POA: Diagnosis not present

## 2019-06-02 DIAGNOSIS — D229 Melanocytic nevi, unspecified: Secondary | ICD-10-CM | POA: Diagnosis not present

## 2019-06-25 NOTE — Progress Notes (Signed)
HPI: FU cardiomyopathy and congestive heart failure. Echocardiogram in February of 2011 showed an ejection fraction of 20-25%, mild biatrial enlargement, mild mitral regurgitation and moderate tricuspid regurgitation. There is a small pericardial effusion. Cardiac catheterization in 2011 showed an ejection fraction of 15% and normal coronary arteries. Patient was treated medically. She was seen at [facility] for treatment of her heart failure and fu echo showed some improvement of LV function.Last echocardiogram August 2019 showed normal LV systolic function.  Since I last saw her,the patient denies any dyspnea on exertion, orthopnea, PND, pedal edema, palpitations, syncope or chest pain.   Current Outpatient Medications  Medication Sig Dispense Refill   acetaminophen (TYLENOL) 500 MG tablet Take 500-1,000 mg by mouth every 6 (six) hours as needed for mild pain or moderate pain.     butalbital-acetaminophen-caffeine (FIORICET, ESGIC) 50-325-40 MG tablet Take 1 tablet by mouth every 6 (six) hours as needed for headache.     carvedilol (COREG) 6.25 MG tablet TAKE 1 TABLET BY MOUTH TWICE DAILY WITH A MEAL 60 tablet 0   FLUoxetine (PROZAC) 20 MG capsule Take 20 mg by mouth at bedtime.     furosemide (LASIX) 20 MG tablet 1 tablet daily as needed for swelling or shortness of breath 90 tablet 3   levonorgestrel (MIRENA) 20 MCG/24HR IUD 1 each by Intrauterine route once.     LORazepam (ATIVAN) 1 MG tablet Take 1 mg by mouth 2 (two) times daily as needed.     methocarbamol (ROBAXIN) 500 MG tablet Take 500 mg by mouth as needed.     nortriptyline (PAMELOR) 25 MG capsule Take 1 capsule (25 mg total) by mouth at bedtime. 30 capsule 2   oxyCODONE-acetaminophen (PERCOCET/ROXICET) 5-325 MG tablet Take 1 tablet by mouth every 6 (six) hours as needed for severe pain. 12 tablet 0   ramipril (ALTACE) 5 MG capsule TAKE 1 CAPSULE BY MOUTH EVERY DAY 30 capsule 0   valACYclovir (VALTREX) 500 MG  tablet Take 500 mg by mouth at bedtime.     zolpidem (AMBIEN CR) 6.25 MG CR tablet Take 6.25 mg by mouth at bedtime as needed for sleep.     zonisamide (ZONEGRAN) 100 MG capsule Take 300 mg by mouth at bedtime.     No current facility-administered medications for this visit.      Past Medical History:  Diagnosis Date   CHF (congestive heart failure) (HCC)    Complication of anesthesia    Depression    Dilated cardiomyopathy [facility]) dx Feb 2011   02/10/10 echo EF 20%, 11/09/10 echo 45%   GERD (gastroesophageal reflux disease)    Herpes    Migraines    PONV (postoperative nausea and vomiting)    Ureteral stone     Past Surgical History:  Procedure Laterality Date   CARDIAC CATHETERIZATION     CESAREAN SECTION     Cold knife conization     CYSTOSCOPY/RETROGRADE/URETEROSCOPY/STONE EXTRACTION WITH BASKET Left 12/17/2016   Procedure: CYSTOSCOPY/RETROGRADE/URETEROSCOPY/STONE EXTRACTION WITH BASKET;  Surgeon: Marcine Matar, MD;  Location: [facility];  Service: Urology;  Laterality: Left;   DILATION AND CURETTAGE OF UTERUS     ECTOPIC PREGNANCY SURGERY     Mirena     Inserted 10-2009   Tonsillectomy     TUBAL LIGATION      Social History   Socioeconomic History   Marital status: Married    Spouse name: Not on file   Number of children: 2   Years of education:  Not on file   Highest education level: Not on file  Occupational History    Employer: [employer] resource strain: Not on file   Food insecurity    Worry: Not on file    Inability: Not on file   Transportation needs    Medical: Not on file    Non-medical: Not on file  Tobacco Use   Smoking status: Never Smoker   Smokeless tobacco: Never Used  Substance and Sexual Activity   Alcohol use: Yes    Alcohol/week: 0.0 standard drinks    Comment: Occasional   Drug use: No   Sexual activity: Yes    Birth control/protection: I.U.D., Surgical     Comment: Inserted 10-2009  BTL  Lifestyle   Physical activity    Days per week: Not on file    Minutes per session: Not on file   Stress: Not on file  Relationships   Social connections    Talks on phone: Not on file    Gets together: Not on file    Attends religious service: Not on file    Active member of club or organization: Not on file    Attends meetings of clubs or organizations: Not on file    Relationship status: Not on file   Intimate partner violence    Fear of current or ex partner: Not on file    Emotionally abused: Not on file    Physically abused: Not on file    Forced sexual activity: Not on file  Other Topics Concern   Not on file  Social History Narrative   She is married and has 2 children.  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    Family History  Problem Relation Age of Onset   Cancer Mother        Ureter   Cancer Father        Colon,Gallbladder   Heart disease Father        Atrial fibrillation   Cancer Brother        Colon    ROS: no fevers or chills, productive cough, hemoptysis, dysphasia, odynophagia, melena, hematochezia, dysuria, hematuria, rash, seizure activity, orthopnea, PND, pedal edema, claudication. Remaining systems are negative.  Physical Exam: Well-developed well-nourished in no acute distress.  Skin is warm and dry.  HEENT is normal.  Neck is supple.  Chest is clear to auscultation with normal expansion.  Cardiovascular exam is regular rate and rhythm.  Abdominal exam nontender or distended. No masses palpated. Extremities show no edema. neuro grossly intact  ECG-sinus rhythm at a rate of 79, nonspecific T wave changes.  Personally reviewed  A/P  1 cardiomyopathy-LV function has normalized.  Continue ACE inhibitor and beta-blocker.  2 chronic diastolic congestive heart failure-she will continue with diuretic as needed.  Continue fluid restriction and low-sodium diet.  Kirk Ruths, MD

## 2019-06-29 ENCOUNTER — Ambulatory Visit: Payer: BC Managed Care – PPO | Admitting: Cardiology

## 2019-06-29 ENCOUNTER — Other Ambulatory Visit: Payer: Self-pay

## 2019-06-29 ENCOUNTER — Encounter: Payer: Self-pay | Admitting: Cardiology

## 2019-06-29 VITALS — BP 110/70 | HR 79 | Ht 64.0 in | Wt 146.0 lb

## 2019-06-29 DIAGNOSIS — I5040 Unspecified combined systolic (congestive) and diastolic (congestive) heart failure: Secondary | ICD-10-CM

## 2019-06-29 DIAGNOSIS — I428 Other cardiomyopathies: Secondary | ICD-10-CM | POA: Diagnosis not present

## 2019-06-29 NOTE — Patient Instructions (Signed)

## 2019-06-30 ENCOUNTER — Other Ambulatory Visit: Payer: Self-pay

## 2019-06-30 DIAGNOSIS — R601 Generalized edema: Secondary | ICD-10-CM

## 2019-06-30 MED ORDER — FUROSEMIDE 20 MG PO TABS: ORAL_TABLET | ORAL | 3 refills | Status: DC

## 2019-07-07 ENCOUNTER — Encounter: Payer: Self-pay | Admitting: Gynecology

## 2019-07-28 ENCOUNTER — Other Ambulatory Visit: Payer: Self-pay

## 2019-07-28 ENCOUNTER — Other Ambulatory Visit: Payer: Self-pay | Admitting: *Deleted

## 2019-07-28 MED ORDER — RAMIPRIL 5 MG PO CAPS: 5.0000 mg | ORAL_CAPSULE | Freq: Every day | ORAL | 11 refills | Status: DC

## 2019-07-28 MED ORDER — CARVEDILOL 6.25 MG PO TABS: ORAL_TABLET | ORAL | 11 refills | Status: DC

## 2019-10-14 DIAGNOSIS — I429 Cardiomyopathy, unspecified: Secondary | ICD-10-CM | POA: Diagnosis not present

## 2019-10-14 DIAGNOSIS — G43009 Migraine without aura, not intractable, without status migrainosus: Secondary | ICD-10-CM | POA: Diagnosis not present

## 2019-10-14 DIAGNOSIS — G4700 Insomnia, unspecified: Secondary | ICD-10-CM | POA: Diagnosis not present

## 2019-11-03 DIAGNOSIS — H04123 Dry eye syndrome of bilateral lacrimal glands: Secondary | ICD-10-CM | POA: Diagnosis not present

## 2019-11-03 DIAGNOSIS — H16143 Punctate keratitis, bilateral: Secondary | ICD-10-CM | POA: Diagnosis not present

## 2019-11-03 DIAGNOSIS — H2513 Age-related nuclear cataract, bilateral: Secondary | ICD-10-CM | POA: Diagnosis not present

## 2019-11-03 DIAGNOSIS — H5203 Hypermetropia, bilateral: Secondary | ICD-10-CM | POA: Diagnosis not present

## 2019-12-14 DIAGNOSIS — R0609 Other forms of dyspnea: Secondary | ICD-10-CM

## 2019-12-14 DIAGNOSIS — I5040 Unspecified combined systolic (congestive) and diastolic (congestive) heart failure: Secondary | ICD-10-CM

## 2019-12-14 DIAGNOSIS — I428 Other cardiomyopathies: Secondary | ICD-10-CM

## 2019-12-16 DIAGNOSIS — I429 Cardiomyopathy, unspecified: Secondary | ICD-10-CM | POA: Diagnosis not present

## 2019-12-30 DIAGNOSIS — Z1231 Encounter for screening mammogram for malignant neoplasm of breast: Secondary | ICD-10-CM | POA: Diagnosis not present

## 2019-12-31 ENCOUNTER — Encounter: Payer: Self-pay | Admitting: *Deleted

## 2020-01-01 DIAGNOSIS — Z1151 Encounter for screening for human papillomavirus (HPV): Secondary | ICD-10-CM | POA: Diagnosis not present

## 2020-01-01 DIAGNOSIS — F419 Anxiety disorder, unspecified: Secondary | ICD-10-CM | POA: Diagnosis not present

## 2020-01-01 DIAGNOSIS — F064 Anxiety disorder due to known physiological condition: Secondary | ICD-10-CM | POA: Diagnosis not present

## 2020-01-01 DIAGNOSIS — N882 Stricture and stenosis of cervix uteri: Secondary | ICD-10-CM | POA: Diagnosis not present

## 2020-01-01 DIAGNOSIS — Z01419 Encounter for gynecological examination (general) (routine) without abnormal findings: Secondary | ICD-10-CM | POA: Diagnosis not present

## 2020-01-01 DIAGNOSIS — Z6823 Body mass index (BMI) 23.0-23.9, adult: Secondary | ICD-10-CM | POA: Diagnosis not present

## 2020-01-01 DIAGNOSIS — G43909 Migraine, unspecified, not intractable, without status migrainosus: Secondary | ICD-10-CM | POA: Diagnosis not present

## 2020-01-04 ENCOUNTER — Ambulatory Visit: Payer: BC Managed Care – PPO | Admitting: Student

## 2020-02-29 DIAGNOSIS — G43709 Chronic migraine without aura, not intractable, without status migrainosus: Secondary | ICD-10-CM | POA: Diagnosis not present

## 2020-02-29 DIAGNOSIS — G4700 Insomnia, unspecified: Secondary | ICD-10-CM | POA: Diagnosis not present

## 2020-03-18 NOTE — Progress Notes (Signed)
HPI: FU cardiomyopathy and congestive heart failure. Echocardiogram in February of 2011 showed an ejection fraction of 20-25%, mild biatrial enlargement, mild mitral regurgitation and moderate tricuspid regurgitation. There is a small pericardial effusion. Cardiac catheterization in 2011 showed an ejection fraction of 15% and normal coronary arteries. Patient was treated medically. She was seen at [facility] for treatment of her heart failure and fu echo showed some improvement of LV function.Last echocardiogram August 2019 showed normal LV systolic function.  Since I last saw her,she notes some dyspnea at times that she attributes to stress.  She has dyspnea with more vigorous activities but not routine activities.  She denies increasing pedal edema.  She has occasional chest pain not related to activities.  She feels this is stress related.  Current Outpatient Medications  Medication Sig Dispense Refill   acetaminophen (TYLENOL) 500 MG tablet Take 500-1,000 mg by mouth every 6 (six) hours as needed for mild pain or moderate pain.     butalbital-acetaminophen-caffeine (FIORICET, ESGIC) 50-325-40 MG tablet Take 1 tablet by mouth every 6 (six) hours as needed for headache.     carvedilol (COREG) 6.25 MG tablet TAKE 1 TABLET BY MOUTH TWICE DAILY WITH A MEAL 60 tablet 11   FLUoxetine (PROZAC) 20 MG capsule Take 20 mg by mouth at bedtime.     furosemide (LASIX) 20 MG tablet 1 tablet daily as needed for swelling or shortness of breath 90 tablet 3   levonorgestrel (MIRENA) 20 MCG/24HR IUD 1 each by Intrauterine route once.     LORazepam (ATIVAN) 0.5 MG tablet Take 1 mg by mouth 2 (two) times daily as needed.     methocarbamol (ROBAXIN) 500 MG tablet Take 500 mg by mouth as needed.     nortriptyline (PAMELOR) 25 MG capsule Take 1 capsule (25 mg total) by mouth at bedtime. 30 capsule 2   ramipril (ALTACE) 5 MG capsule Take 1 capsule (5 mg total) by mouth daily. 30 capsule 11    valACYclovir (VALTREX) 500 MG tablet Take 500 mg by mouth at bedtime.     zolpidem (AMBIEN CR) 6.25 MG CR tablet Take 6.25 mg by mouth at bedtime as needed for sleep.     zonisamide (ZONEGRAN) 100 MG capsule Take 300 mg by mouth at bedtime.     No current facility-administered medications for this visit.     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   02/10/10 echo EF 20%, 11/09/10 echo 45%   GERD (gastroesophageal reflux disease)    Herpes    Migraines    PONV (postoperative nausea and vomiting)    Ureteral stone     Past Surgical History:  Procedure Laterality Date   CARDIAC CATHETERIZATION     CESAREAN SECTION     Cold knife conization     CYSTOSCOPY/RETROGRADE/URETEROSCOPY/STONE EXTRACTION WITH BASKET Left 12/17/2016   Procedure: CYSTOSCOPY/RETROGRADE/URETEROSCOPY/STONE EXTRACTION WITH BASKET;  Surgeon: Marcine Matar, MD;  Location: [facility];  Service: Urology;  Laterality: Left;   DILATION AND CURETTAGE OF UTERUS     ECTOPIC PREGNANCY SURGERY     Mirena     Inserted 10-2009   Tonsillectomy     TUBAL LIGATION      Social History   Socioeconomic History   Marital status: Married    Spouse name: Not on file   Number of children:  2   Years of education: Not on file   Highest education level: Not on file  Occupational History    Employer: [employer]  Tobacco Use   Smoking status: Never Smoker   Smokeless tobacco: Never Used  Vaping Use   Vaping Use: Never used  Substance and Sexual Activity   Alcohol use: Yes    Alcohol/week: 0.0 standard drinks    Comment: Occasional   Drug use: No   Sexual activity: Yes    Birth control/protection: I.U.D., Surgical    Comment: Inserted 10-2009  BTL  Other Topics Concern   Not on file  Social History Narrative   She is married and has 2 children.  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   Social Determinants of Health   Financial Resource Strain:    Difficulty of Paying Living Expenses:   Food Insecurity:    Worried About Charity fundraiser in the Last Year:    Arboriculturist in the Last Year:   Transportation Needs:    Film/video editor (Medical):    Lack of Transportation (Non-Medical):   Physical Activity:    Days of Exercise per Week:    Minutes of Exercise per Session:   Stress:    Feeling of Stress :   Social Connections:    Frequency of Communication with Friends and Family:    Frequency of Social Gatherings with Friends and Family:    Attends Religious Services:    Active Member of Clubs or Organizations:    Attends Music therapist:    Marital Status:   Intimate Partner Violence:    Fear of Current or Ex-Partner:    Emotionally Abused:    Physically Abused:    Sexually Abused:     Family History  Problem Relation Age of Onset   Cancer Mother        Ureter   Cancer Father        Colon,Gallbladder   Heart disease Father        Atrial fibrillation   Cancer Brother        Colon    ROS: no fevers or chills, productive cough, hemoptysis, dysphasia, odynophagia, melena, hematochezia, dysuria, hematuria, rash, seizure activity, orthopnea, PND, pedal edema, claudication. Remaining systems are negative.  Physical Exam: Well-developed well-nourished in no acute distress.  Skin is warm and dry.  HEENT is normal.  Neck is supple.  Chest is clear to auscultation with normal expansion.  Cardiovascular exam is regular rate and rhythm.  Abdominal exam nontender or distended. No masses palpated. Extremities show no edema. neuro grossly intact  ECG- Sinus rhythm at a rate of 82, nonspecific ST-T wave changes.  Personally reviewed  A/P  1 NICM-improved on most recent echo; continue present medical regimen including coreg and altace; she described some  increased dyspnea that she attributes to stress.  She is not volume overloaded on examination.  We will plan to repeat echocardiogram.  Note her recent BNP was normal.  2 chronic diastolic CHF-continue lasix as needed; continue fluid restriction and low Na diet.  Kirk Ruths, MD

## 2020-03-28 ENCOUNTER — Encounter: Payer: Self-pay | Admitting: Cardiology

## 2020-03-28 ENCOUNTER — Ambulatory Visit: Payer: BC Managed Care – PPO | Admitting: Cardiology

## 2020-03-28 ENCOUNTER — Other Ambulatory Visit: Payer: Self-pay

## 2020-03-28 VITALS — BP 118/72 | HR 82 | Ht 64.0 in | Wt 143.8 lb

## 2020-03-28 DIAGNOSIS — I5040 Unspecified combined systolic (congestive) and diastolic (congestive) heart failure: Secondary | ICD-10-CM | POA: Diagnosis not present

## 2020-03-28 DIAGNOSIS — R0600 Dyspnea, unspecified: Secondary | ICD-10-CM

## 2020-03-28 DIAGNOSIS — I428 Other cardiomyopathies: Secondary | ICD-10-CM

## 2020-03-28 DIAGNOSIS — R0609 Other forms of dyspnea: Secondary | ICD-10-CM

## 2020-03-28 NOTE — Patient Instructions (Signed)
Medication Instructions:  NO CHANGE *If you need a refill on your cardiac medications before your next appointment, please call your pharmacy*   Lab Work: If you have labs (blood work) drawn today and your tests are completely normal, you will receive your results only by: . MyChart Message (if you have MyChart) OR . A paper copy in the mail If you have any lab test that is abnormal or we need to change your treatment, we will call you to review the results.   Testing/Procedures:  Your physician has requested that you have an echocardiogram. Echocardiography is a painless test that uses sound waves to create images of your heart. It provides your doctor with information about the size and shape of your heart and how well your heart's chambers and valves are working. This procedure takes approximately one hour. There are no restrictions for this procedure. [address]     Follow-Up: At [facility], you and your health needs are our priority.  As part of our continuing mission to provide you with exceptional heart care, we have created designated Provider Care Teams.  These Care Teams include your primary Cardiologist (physician) and Advanced Practice Providers (APPs -  Physician Assistants and Nurse Practitioners) who all work together to provide you with the care you need, when you need it.  We recommend signing up for the patient portal called "MyChart".  Sign up information is provided on this After Visit Summary.  MyChart is used to connect with patients for Virtual Visits (Telemedicine).  Patients are able to view lab/test results, encounter notes, upcoming appointments, etc.  Non-urgent messages can be sent to your provider as well.   To learn more about what you can do with MyChart, go to [email or web address].    Your next appointment:   6 month(s)  The format for your next appointment:   In Person  Provider:   You may see BRIAN CRENSHAW MD or one of the  following Advanced Practice Providers on your designated Care Team:    Luke Kilroy, PA-C  Callie Goodrich, PA-C  Jesse Cleaver, FNP     

## 2020-03-30 ENCOUNTER — Ambulatory Visit (HOSPITAL_COMMUNITY): Payer: BC Managed Care – PPO | Attending: Internal Medicine

## 2020-03-30 ENCOUNTER — Other Ambulatory Visit: Payer: Self-pay

## 2020-03-30 DIAGNOSIS — I5040 Unspecified combined systolic (congestive) and diastolic (congestive) heart failure: Secondary | ICD-10-CM | POA: Diagnosis not present

## 2020-03-30 DIAGNOSIS — I428 Other cardiomyopathies: Secondary | ICD-10-CM

## 2020-03-30 DIAGNOSIS — R0600 Dyspnea, unspecified: Secondary | ICD-10-CM | POA: Diagnosis not present

## 2020-03-30 DIAGNOSIS — R0609 Other forms of dyspnea: Secondary | ICD-10-CM

## 2020-04-21 DIAGNOSIS — Z130 Encounter for screening for diseases of the blood and blood-forming organs and certain disorders involving the immune mechanism: Secondary | ICD-10-CM | POA: Diagnosis not present

## 2020-04-21 DIAGNOSIS — Z0000 Encounter for general adult medical examination without abnormal findings: Secondary | ICD-10-CM | POA: Diagnosis not present

## 2020-04-21 DIAGNOSIS — Z13220 Encounter for screening for lipoid disorders: Secondary | ICD-10-CM | POA: Diagnosis not present

## 2020-04-21 DIAGNOSIS — Z1329 Encounter for screening for other suspected endocrine disorder: Secondary | ICD-10-CM | POA: Diagnosis not present

## 2020-04-21 DIAGNOSIS — E559 Vitamin D deficiency, unspecified: Secondary | ICD-10-CM | POA: Diagnosis not present

## 2020-04-21 DIAGNOSIS — Z131 Encounter for screening for diabetes mellitus: Secondary | ICD-10-CM | POA: Diagnosis not present

## 2020-05-27 DIAGNOSIS — Z800 Family history of malignant neoplasm of digestive organs: Secondary | ICD-10-CM | POA: Diagnosis not present

## 2020-05-27 DIAGNOSIS — Z1211 Encounter for screening for malignant neoplasm of colon: Secondary | ICD-10-CM | POA: Diagnosis not present

## 2020-05-31 ENCOUNTER — Other Ambulatory Visit: Payer: Self-pay

## 2020-05-31 ENCOUNTER — Ambulatory Visit: Payer: BC Managed Care – PPO | Admitting: Neurology

## 2020-05-31 ENCOUNTER — Encounter: Payer: Self-pay | Admitting: Neurology

## 2020-05-31 VITALS — BP 114/68 | HR 81 | Ht 64.0 in | Wt 142.0 lb

## 2020-05-31 DIAGNOSIS — G43009 Migraine without aura, not intractable, without status migrainosus: Secondary | ICD-10-CM | POA: Diagnosis not present

## 2020-05-31 MED ORDER — AJOVY 225 MG/1.5ML ~~LOC~~ SOAJ: 1.0000 "pen " | Freq: Once | SUBCUTANEOUS | 0 refills | Status: AC

## 2020-05-31 MED ORDER — AJOVY 225 MG/1.5ML ~~LOC~~ SOAJ
SUBCUTANEOUS | 5 refills | Status: DC
Start: 2020-05-31 — End: 2020-05-31

## 2020-05-31 MED ORDER — BUTALBITAL-APAP-CAFFEINE 50-325-40 MG PO TABS: 1.0000 | ORAL_TABLET | Freq: Four times a day (QID) | ORAL | 2 refills | Status: DC | PRN

## 2020-05-31 MED ORDER — NORTRIPTYLINE HCL 25 MG PO CAPS: 25.0000 mg | ORAL_CAPSULE | Freq: Every day | ORAL | 2 refills | Status: DC

## 2020-05-31 MED ORDER — ZONISAMIDE 100 MG PO CAPS
300.0000 mg | ORAL_CAPSULE | Freq: Every day | ORAL | 6 refills | Status: DC
Start: 2020-05-31 — End: 2021-07-24

## 2020-05-31 MED ORDER — AJOVY 225 MG/1.5ML ~~LOC~~ SOSY: 1.5000 mL | PREFILLED_SYRINGE | SUBCUTANEOUS | 5 refills | Status: DC

## 2020-05-31 NOTE — Addendum Note (Signed)
Addended by: Judi Cong on: 05/31/2020 04:11 PM   Modules accepted: Orders

## 2020-05-31 NOTE — Patient Instructions (Signed)
  Fremanezumab injection What is this medicine? FREMANEZUMAB (fre ma NEZ ue mab) is used to prevent migraine headaches. This medicine may be used for other purposes; ask your health care provider or pharmacist if you have questions. COMMON BRAND NAME(S): AJOVY What should I tell my health care provider before I take this medicine? They need to know if you have any of these conditions:  an unusual or allergic reaction to fremanezumab, other medicines, foods, dyes, or preservatives  pregnant or trying to get pregnant  breast-feeding How should I use this medicine? This medicine is for injection under the skin. You will be taught how to prepare and give this medicine. Use exactly as directed. Take your medicine at regular intervals. Do not take your medicine more often than directed. It is important that you put your used needles and syringes in a special sharps container. Do not put them in a trash can. If you do not have a sharps container, call your pharmacist or healthcare provider to get one. Talk to your pediatrician regarding the use of this medicine in children. Special care may be needed. Overdosage: If you think you have taken too much of this medicine contact a poison control center or emergency room at once. NOTE: This medicine is only for you. Do not share this medicine with others. What if I miss a dose? If you miss a dose, take it as soon as you can. If it is almost time for your next dose, take only that dose. Do not take double or extra doses. What may interact with this medicine? Interactions are not expected. This list may not describe all possible interactions. Give your health care provider a list of all the medicines, herbs, non-prescription drugs, or dietary supplements you use. Also tell them if you smoke, drink alcohol, or use illegal drugs. Some items may interact with your medicine. What should I watch for while using this medicine? Tell your doctor or healthcare  professional if your symptoms do not start to get better or if they get worse. What side effects may I notice from receiving this medicine? Side effects that you should report to your doctor or health care professional as soon as possible:  allergic reactions like skin rash, itching or hives, swelling of the face, lips, or tongue Side effects that usually do not require medical attention (report these to your doctor or health care professional if they continue or are bothersome):  pain, redness, or irritation at site where injected This list may not describe all possible side effects. Call your doctor for medical advice about side effects. You may report side effects to FDA at 1-800-FDA-1088. Where should I keep my medicine? Keep out of the reach of children. You will be instructed on how to store this medicine. Throw away any unused medicine after the expiration date on the label. NOTE: This sheet is a summary. It may not cover all possible information. If you have questions about this medicine, talk to your doctor, pharmacist, or health care provider.  2020 Elsevier/Gold Standard (2017-06-24 17:22:56)  

## 2020-05-31 NOTE — Progress Notes (Signed)
SLEEP MEDICINE CLINIC    Provider:  Melvyn Novas, MD  Primary Care Physician:  Krystal Mcardle, MD [address] [place] [address]     Referring Provider: [name], Md [address] [place],  [place] [address]          Chief Complaint according to patient   Patient presents with:    . New Patient (Initial Visit)           HISTORY OF PRESENT ILLNESS:  Krystal Bradley is a 59 y.o. year old- Caucasian female patient is seen here for transfer of Migraine care on 05/31/2020.   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  She has for many years followed Krystal Bradley as her headache specialist. She has had migraines for at least 35 years.  Used to be related to menstrual cycle.  Krystal Bradley has moved to [place], she is looking for continuation of her care.  We have the last visit was not been taking available here which stated that the patient was given Ubrelvy samples.  That she cannot have triptans because of a history of a post viral cardiomyopathy and CHF.  She retained fluid in 2011 and learnt to her surprise that she had CHF- She is followed by Dr. Jens Bradley at [facility] heart.  She has regular echocardiograms which she states have been normal.  He does have a history of hearing loss headaches insomnia anxiety and the feeling that she does not get enough sleep..  I would like to add that the drug dependent or unrelieved medication did cause her to feel nauseated but she did not get relief in terms of headaches.  She has never been on any of the injectable medications CRP such as Ajovy, Emgality, Aimovig.   I have the pleasure of seeing Krystal Bradley today, a right -handed White or Caucasian female with a headache and sleep disorder.  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   The patient never had a sleep study.   Sleep relevant medical history: Insonmia.  Family medical /sleep history:NO  other family member with OSA, insomnia, sleep walkers. mother died when she was young, father had atrial fibrillation, died of pancreatic cancer.   Social history: Patient is working as a Surveyor, mining  and lives in a household with her spouse,- with adult children.  The patient currently works in office, day time. Pets are present. One cat.  Tobacco use- never. ETOH use:  Beer 1-2 / weekly , Caffeine intake in form of Coffee( 1 cup in AM) Soda( /) Tea ( / ) nor energy drinks.Regular exercise: none   Hobbies :DIY projects.     Sleep habits are as follows: The patient's dinner time is between 7 PM. The patient goes to bed at 10 PM and takes AMBIEN every night -continues to sleep for 6-7 hours, wakes rarely for bathroom breaks.   The preferred sleep position is laterally, with the support of one pillow. Dreams are reportedly frequent/vivid.  5.30 AM is the usual rise time. The patient wakes up with an alarm.  She reports  feeling refreshed - restored in AM, with symptoms such as dry mouth ,some morning headaches , dull- not  nauseated. and residual fatigue.  Naps are taken infrequently.   Review of Systems: Out of a complete 14 system review, the patient complains of only the following symptoms, and all other reviewed systems are negative.:  Fatigue, sleepiness , snoring, fragmented sleep, Insomnia chronic     Ambien dependend for years. Chronic insomnia. Has seen sleep neuro in [place]-    How likely are you to doze in the following situations: 0 = not likely, 1 = slight chance, 2 = moderate chance, 3 = high chance   Sitting and Reading? Watching Television? Sitting inactive in a public place (theater or  meeting)? As a passenger in a car for an hour without a break? Lying down in the afternoon when circumstances permit? Sitting and talking to someone? Sitting quietly after lunch without alcohol? In a car, while stopped for a few minutes in traffic?   Total = 7/ 24 points   Social History   Socioeconomic History  . Marital status: Married    Spouse name: Not on file  . Number of children: 2  . Years of education: Not on file  . Highest education level: Not on file  Occupational History    Employer: [employer]  Tobacco Use  . Smoking status: Never Smoker  . Smokeless tobacco: Never Used  Vaping Use  . Vaping Use: Never used  Substance and Sexual Activity  . Alcohol use: Yes    Alcohol/week: 0.0 standard drinks    Comment: Occasional  . Drug use: No  . Sexual activity: Yes    Birth control/protection: I.U.D., Surgical    Comment: Inserted 10-2009  BTL  Other Topics Concern  . Not on file  Social History Narrative   She is married and has 2 children.  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   Social Determinants of Health   Financial Resource Strain:   . Difficulty of Paying Living Expenses: Not on file  Food Insecurity:   . Worried About Programme researcher, broadcasting/film/video in the Last Year: Not on file  . Ran Out of Food in the Last Year: Not on file  Transportation Needs:   . Lack of Transportation (Medical): Not on file  . Lack of Transportation (Non-Medical): Not on file  Physical Activity:   . Days of Exercise per Week: Not on file  . Minutes of Exercise per Session: Not on file  Stress:   . Feeling of Stress : Not on file  Social Connections:   . Frequency of Communication with Friends and Family: Not on file  . Frequency of Social Gatherings with Friends and Family: Not on file  . Attends Religious Services: Not on file  . Active Member of Clubs or Organizations: Not on file  . Attends Banker Meetings: Not on file  .  Marital Status: Not on file    Family History  Problem Relation Age of Onset  . Cancer Mother        Ureter  . Cancer Father        Colon,Gallbladder  . Heart disease Father        Atrial fibrillation  . Cancer Brother        Colon    Past Medical History:  Diagnosis Date  . CHF (congestive heart failure) (HCC)   . Complication of anesthesia   . Depression   . Dilated cardiomyopathy [facility]) dx Feb 2011   02/10/10 echo EF 20%, 11/09/10 echo 45%  . GERD (  gastroesophageal reflux disease)   . Herpes   . Migraines   . PONV (postoperative nausea and vomiting)   . Ureteral stone     Past Surgical History:  Procedure Laterality Date  . CARDIAC CATHETERIZATION    . CESAREAN SECTION    . Cold knife conization    . CYSTOSCOPY/RETROGRADE/URETEROSCOPY/STONE EXTRACTION WITH BASKET Left 12/17/2016   Procedure: CYSTOSCOPY/RETROGRADE/URETEROSCOPY/STONE EXTRACTION WITH BASKET;  Surgeon: Marcine Matar, MD;  Location: [facility];  Service: Urology;  Laterality: Left;  . DILATION AND CURETTAGE OF UTERUS    . ECTOPIC PREGNANCY SURGERY    . Mirena     Inserted 10-2009  . Tonsillectomy    . TUBAL LIGATION       Current Outpatient Medications on File Prior to Visit  Medication Sig Dispense Refill  . acetaminophen (TYLENOL) 500 MG tablet Take 500-1,000 mg by mouth every 6 (six) hours as needed for mild pain or moderate pain.    . butalbital-acetaminophen-caffeine (FIORICET, ESGIC) 50-325-40 MG tablet Take 1 tablet by mouth every 6 (six) hours as needed for headache.    . carvedilol (COREG) 6.25 MG tablet TAKE 1 TABLET BY MOUTH TWICE DAILY WITH A MEAL 60 tablet 11  . FLUoxetine (PROZAC) 20 MG capsule Take 20 mg by mouth at bedtime.    . furosemide (LASIX) 20 MG tablet 1 tablet daily as needed for swelling or shortness of breath 90 tablet 3  . levonorgestrel (MIRENA) 20 MCG/24HR IUD 1 each by Intrauterine route once.    Marland Kitchen LORazepam (ATIVAN) 2 MG tablet Take 2 mg by mouth 2 (two) times daily as  needed.     . methocarbamol (ROBAXIN) 500 MG tablet Take 500 mg by mouth as needed.    . nortriptyline (PAMELOR) 25 MG capsule Take 1 capsule (25 mg total) by mouth at bedtime. 30 capsule 2  . ramipril (ALTACE) 5 MG capsule Take 1 capsule (5 mg total) by mouth daily. 30 capsule 11  . valACYclovir (VALTREX) 500 MG tablet Take 500 mg by mouth at bedtime.    Marland Kitchen zolpidem (AMBIEN CR) 12.5 MG CR tablet Take 12.5 mg by mouth at bedtime as needed for sleep.     Marland Kitchen zonisamide (ZONEGRAN) 100 MG capsule Take 300 mg by mouth at bedtime.    . Vitamin D, Ergocalciferol, (DRISDOL) 1.25 MG (50000 UNIT) CAPS capsule Take 50,000 Units by mouth once a week.     No current facility-administered medications on file prior to visit.    Allergies  Allergen Reactions  . Ibuprofen Hives  . Penicillins Hives and Other (See Comments)    Has patient had a PCN reaction causing immediate rash, facial/tongue/throat swelling, SOB or lightheadedness with hypotension: No Has patient had a PCN reaction causing severe rash involving mucus membranes or skin necrosis: No Has patient had a PCN reaction that required hospitalization No Has patient had a PCN reaction occurring within the last 10 years: No If all of the above answers are "NO", then may proceed with Cephalosporin use.  . Cephalosporins Hives    Physical exam:  Today's Vitals   05/31/20 1446  BP: 114/68  Pulse: 81  Weight: 142 lb (64.4 kg)  Height:  (1.626 m)   Body mass index is 24.37 kg/m.   Wt Readings from Last 3 Encounters:  05/31/20 142 lb (64.4 kg)  03/28/20 143 lb 12.8 oz (65.2 kg)  06/29/19 146 lb (66.2 kg)     Ht Readings from Last 3 Encounters:  05/31/20  (1.626  m)  03/28/20 5\' 4"  (1.626 m)  06/29/19 5\' 4"  (1.626 m)      General: The patient is awake, alert and appears not in acute distress. The patient is well groomed. Head: Normocephalic, atraumatic.  Neck is supple. Mallampati: ,  neck circumference: 14.5  inches . Nasal  airflow  patent.  Retrognathia is not  seen.  Dental status: intact - Cardiovascular:  Regular rate and cardiac rhythm by pulse,  without distended neck veins. Respiratory: Lungs are clear to auscultation.  Skin:  Without evidence of ankle edema, or rash. Trunk: The patient's posture is erect.   Neurologic exam : The patient is awake and alert, oriented to place and time.   Memory subjective described as intact.  Attention span & concentration ability appears normal.  Speech is fluent,  without  dysarthria, dysphonia or aphasia.  Mood and affect are appropriate.   Cranial nerves: no loss of smell or taste reported  Pupils are equal and briskly reactive to light. Funduscopic exam deferred. .  Extraocular movements in vertical and horizontal planes were intact and without nystagmus. No Diplopia. Visual fields by finger perimetry are intact. Hearing was intact to soft voice and finger rubbing.    Facial sensation intact to fine touch.  Facial motor strength is symmetric and tongue and uvula move midline.  Neck ROM : rotation, tilt and flexion extension were normal for age and shoulder shrug was symmetrical.  She reports tension.    Motor exam:  Symmetric bulk, tone and ROM.   Normal tone without cog- wheeling, symmetric grip strength .   Sensory:  Fine touch and vibration were normal.  Proprioception tested in the upper extremities was normal.   Coordination: Rapid alternating movements in the fingers/hands were of normal speed.  The Finger-to-nose maneuver was intact without evidence of ataxia, dysmetria or tremor.   Gait and station: Patient could rise unassisted from a seated position, walked without assistive device.  Toe and heel walk were deferred.  Deep tendon reflexes: in the  upper and lower extremities are symmetric and intact.  Babinski response was deferred.      After spending a total time of  55  minutes face to face and additional time for physical and neurologic  examination, review of laboratory studies,  personal review of imaging studies, reports and results of other testing and review of referral information / records as far as provided in visit, I have established the following assessments:  This patient has failed: imitrex before cardiomyopathy: Topamax, beta blocker/ propanolol, Verapamil, Depakote. Nurtec and Ubrelvy.   1) Migraine in form of retroorbital throbbing , nausea, photophobia. But no visual aura. Now down to 2-5 migraines a month. Still having a headache 3-4 times a week.  At least 15 days /month.   2) viral cardiomyopathy with CHF history- not a candidate for triptan.   3) insomnia chronic, not an organic sleep disorder. No apnea or RLS.   4) Headaches have sometimes woken her- these are stabbing , sharp, orbital - cluster- type. These sleep HA are not chronic.    My Plan is to proceed with:  1) continue on maintenance medication - per Dr 07/01/19. ZONEGRAN, PAMELOR, ROBAXIN prn.   Acute headaches responded to Butalbutal.  Use sparingly for cluster at night.  2) preventive injectible Ajovy-  Will give first doe here.    I would like to thank [name], MD and Sherrie Mustache, Md [address] [place],  [address] [place], [address] [place] for allowing  me to meet with and to take care of this pleasant patient.   In short, Krystal Bradley is presenting with chronic headaches, migraines and cardiomyoathy . I plan to follow up either personally or through our NP within 2-3  month.   CC: I will share my notes with  PCP, .  Electronically signed by: Krystal Novas, MD 05/31/2020 2:51 PM  [facility] Board certified by The [employer] of Sleep Medicine and Diplomate of the [employer] of Sleep Medicine. Board certified In Neurology through the ABPN, Fellow of the [employer] of Neurology. Medical Director of [facility].

## 2020-06-01 ENCOUNTER — Telehealth: Payer: Self-pay | Admitting: Neurology

## 2020-06-01 NOTE — Telephone Encounter (Signed)
PA completed through cover my meds/BCBS for the patient. KEY: [id number] Will hear something within 72 hrs.

## 2020-06-06 ENCOUNTER — Encounter: Payer: Self-pay | Admitting: Neurology

## 2020-06-06 MED ORDER — AIMOVIG 140 MG/ML ~~LOC~~ SOAJ: 140.0000 mg | SUBCUTANEOUS | 11 refills | Status: DC

## 2020-06-06 NOTE — Telephone Encounter (Signed)
Ajovy was denied for the patient. Insurance would like her to have aimovig/emgality first.

## 2020-06-06 NOTE — Telephone Encounter (Signed)
Dr Dohmeier has agreed to start the patient on Aimovig since insurance will cover for the patient rather then the Ajovy. I will send a new script and let the patient know.

## 2020-06-06 NOTE — Addendum Note (Signed)
Addended by: Judi Cong on: 06/06/2020 11:55 AM   Modules accepted: Orders

## 2020-06-07 DIAGNOSIS — M545 Low back pain: Secondary | ICD-10-CM | POA: Diagnosis not present

## 2020-06-07 DIAGNOSIS — M5416 Radiculopathy, lumbar region: Secondary | ICD-10-CM | POA: Diagnosis not present

## 2020-06-08 ENCOUNTER — Telehealth: Payer: Self-pay | Admitting: Neurology

## 2020-06-08 NOTE — Telephone Encounter (Signed)
PA submitted for the patient through cover my meds/BCBS Since insurance requires trying aimovig first.  UXY:[id number] Can take up to 72 hrs before receiving a response.

## 2020-06-14 NOTE — Telephone Encounter (Signed)
Approved for the patient until 09/05/2020 through [facility]

## 2020-07-18 ENCOUNTER — Other Ambulatory Visit: Payer: Self-pay | Admitting: Cardiology

## 2020-07-27 DIAGNOSIS — Z20822 Contact with and (suspected) exposure to covid-19: Secondary | ICD-10-CM | POA: Diagnosis not present

## 2020-08-23 ENCOUNTER — Other Ambulatory Visit: Payer: Self-pay | Admitting: Cardiology

## 2020-08-23 DIAGNOSIS — R601 Generalized edema: Secondary | ICD-10-CM

## 2020-08-23 DIAGNOSIS — F321 Major depressive disorder, single episode, moderate: Secondary | ICD-10-CM | POA: Diagnosis not present

## 2020-08-25 ENCOUNTER — Telehealth: Payer: Self-pay | Admitting: Neurology

## 2020-08-25 NOTE — Telephone Encounter (Signed)
PA completed through cover my meds/ BCBS KEY:[id number] Will wait for response

## 2020-08-29 NOTE — Telephone Encounter (Signed)
PA approved for the patient 08/25/20-08/24/2021. Through [employer] [place]

## 2020-09-07 ENCOUNTER — Ambulatory Visit: Payer: BC Managed Care – PPO | Admitting: Family Medicine

## 2020-09-07 ENCOUNTER — Encounter: Payer: Self-pay | Admitting: Family Medicine

## 2020-09-07 ENCOUNTER — Other Ambulatory Visit: Payer: Self-pay

## 2020-09-07 VITALS — BP 118/69 | HR 79 | Ht 64.0 in | Wt 147.0 lb

## 2020-09-07 DIAGNOSIS — G43009 Migraine without aura, not intractable, without status migrainosus: Secondary | ICD-10-CM

## 2020-09-07 MED ORDER — AJOVY 225 MG/1.5ML ~~LOC~~ SOAJ: 225.0000 mg | SUBCUTANEOUS | 3 refills | Status: DC

## 2020-09-07 MED ORDER — NORTRIPTYLINE HCL 25 MG PO CAPS: 25.0000 mg | ORAL_CAPSULE | Freq: Every day | ORAL | 3 refills | Status: DC

## 2020-09-07 MED ORDER — METHOCARBAMOL 500 MG PO TABS: 500.0000 mg | ORAL_TABLET | ORAL | 5 refills | Status: DC | PRN

## 2020-09-07 NOTE — Progress Notes (Signed)
HPI: FU cardiomyopathy and congestive heart failure. Echocardiogram in February of 2011 showed an ejection fraction of 20-25%, mild biatrial enlargement, mild mitral regurgitation and moderate tricuspid regurgitation. There is a small pericardial effusion. Cardiac catheterization in 2011 showed an ejection fraction of 15% and normal coronary arteries. Patient was treated medically. Echocardiogram repeated June 2021 and showed ejection fraction 45 to 50%, mild asymmetric septal hypertrophy, grade 1 diastolic dysfunction and trace aortic insufficiency.  I reviewed and felt ejection fraction low normal to mildly reduced.  Medical therapy was continued.  Since I last saw her,the patient has dyspnea with more extreme activities but not with routine activities. It is relieved with rest. It is not associated with chest pain. There is no orthopnea, PND or pedal edema. There is no syncope or palpitations. There is no exertional chest pain.   Current Outpatient Medications  Medication Sig Dispense Refill  . acetaminophen (TYLENOL) 500 MG tablet Take 500-1,000 mg by mouth every 6 (six) hours as needed for mild pain or moderate pain.    . butalbital-acetaminophen-caffeine (FIORICET) 50-325-40 MG tablet Take 1 tablet by mouth every 6 (six) hours as needed for headache. 14 tablet 2  . carvedilol (COREG) 6.25 MG tablet TAKE 1 TABLET BY MOUTH TWICE DAILY WITH A MEAL 60 tablet 5  . DULoxetine (CYMBALTA) 60 MG capsule Take 60 mg by mouth daily.    . Fremanezumab-vfrm (AJOVY) 225 MG/1.5ML SOAJ Inject 225 mg into the skin every 30 (thirty) days. 4.5 mL 3  . furosemide (LASIX) 20 MG tablet TAKE 1 TABLET BY MOUTH DAILY AS NEEDED FOR SWELLING OR SHORTNESS OF BREATH 90 tablet 3  . levonorgestrel (MIRENA) 20 MCG/24HR IUD 1 each by Intrauterine route once.    Marland Kitchen LORazepam (ATIVAN) 2 MG tablet Take 2 mg by mouth 2 (two) times daily as needed.     . methocarbamol (ROBAXIN) 500 MG tablet As need , not to exceed one a day  po 30 tablet 5  . nortriptyline (PAMELOR) 25 MG capsule Take 1 capsule (25 mg total) by mouth at bedtime. 90 capsule 3  . ramipril (ALTACE) 5 MG capsule TAKE 1 CAPSULE(5 MG) BY MOUTH DAILY 30 capsule 5  . valACYclovir (VALTREX) 500 MG tablet Take 500 mg by mouth at bedtime.    . Vitamin D, Ergocalciferol, (DRISDOL) 1.25 MG (50000 UNIT) CAPS capsule Take 50,000 Units by mouth once a week.    . zolpidem (AMBIEN CR) 12.5 MG CR tablet Take 12.5 mg by mouth at bedtime as needed for sleep.     Marland Kitchen zonisamide (ZONEGRAN) 100 MG capsule Take 3 capsules (300 mg total) by mouth at bedtime. 270 capsule 6   No current facility-administered medications for this visit.     Past Medical History:  Diagnosis Date  . CHF (congestive heart failure) (HCC)   . Complication of anesthesia   . Depression   . Dilated cardiomyopathy [facility]) dx Feb 2011   02/10/10 echo EF 20%, 11/09/10 echo 45%  . GERD (gastroesophageal reflux disease)   . Herpes   . Migraines   . PONV (postoperative nausea and vomiting)   . Ureteral stone     Past Surgical History:  Procedure Laterality Date  . CARDIAC CATHETERIZATION    . CESAREAN SECTION    . Cold knife conization    . CYSTOSCOPY/RETROGRADE/URETEROSCOPY/STONE EXTRACTION WITH BASKET Left 12/17/2016   Procedure: CYSTOSCOPY/RETROGRADE/URETEROSCOPY/STONE EXTRACTION WITH BASKET;  Surgeon: Marcine Matar, MD;  Location: [facility];  Service: Urology;  Laterality: Left;  .  DILATION AND CURETTAGE OF UTERUS    . ECTOPIC PREGNANCY SURGERY    . Mirena     Inserted 10-2009  . Tonsillectomy    . TUBAL LIGATION      Social History   Socioeconomic History  . Marital status: Married    Spouse name: Not on file  . Number of children: 2  . Years of education: Not on file  . Highest education level: Not on file  Occupational History    Employer: [employer]  Tobacco Use  . Smoking status: Never Smoker  . Smokeless tobacco: Never Used  Vaping Use  . Vaping Use:  Never used  Substance and Sexual Activity  . Alcohol use: Yes    Alcohol/week: 0.0 standard drinks    Comment: Occasional  . Drug use: No  . Sexual activity: Yes    Birth control/protection: I.U.D., Surgical    Comment: Inserted 10-2009  BTL  Other Topics Concern  . Not on file  Social History Narrative   She is married and has 2 children.  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   Social Determinants of Health   Financial Resource Strain: Not on file  Food Insecurity: Not on file  Transportation Needs: Not on file  Physical Activity: Not on file  Stress: Not on file  Social Connections: Not on file  Intimate Partner Violence: Not on file    Family History  Problem Relation Age of Onset  . Cancer Mother        Ureter  . Cancer Father        Colon,Gallbladder  . Heart disease Father        Atrial fibrillation  . Cancer Brother        Colon    ROS: no fevers or chills, productive cough, hemoptysis, dysphasia, odynophagia, melena, hematochezia, dysuria, hematuria, rash, seizure activity, orthopnea, PND, pedal edema, claudication. Remaining systems are negative.  Physical Exam: Well-developed well-nourished in no acute distress.  Skin is warm and dry.  HEENT is normal.  Neck is supple.  Chest is clear to auscultation with normal expansion.  Cardiovascular exam is regular rate and rhythm.  Abdominal exam nontender or distended. No masses palpated. Extremities show no edema. neuro grossly intact   A/P  1 nonischemic cardiomyopathy-LV function low normal to mildly reduced on most recent echocardiogram.  Continue carvedilol and Altace.  She does not appear to be volume overloaded on examination.  2 chronic diastolic congestive heart failure-she will continue Lasix as needed.  Needs low-sodium diet and fluid restriction.  Olga Millers, MD

## 2020-09-07 NOTE — Patient Instructions (Signed)
Below is our plan:  We will continue Ajovy, zonisamide, nortriptyline for prevention and robaxin and butalbital sparingly for abortive therapy.   Please make sure you are staying well hydrated. I recommend 50-60 ounces daily. Well balanced diet and regular exercise encouraged.    Please continue follow up with care team as directed.   Follow up in 1 year   You may receive a survey regarding today's visit. I encourage you to leave honest feed back as I do use this information to improve patient care. Thank you for seeing me today!      Migraine Headache A migraine headache is a very strong throbbing pain on one side or both sides of your head. This type of headache can also cause other symptoms. It can last from 4 hours to 3 days. Talk with your doctor about what things may bring on (trigger) this condition. What are the causes? The exact cause of this condition is not known. This condition may be triggered or caused by:  Drinking alcohol.  Smoking.  Taking medicines, such as: ? Medicine used to treat chest pain (nitroglycerin). ? Birth control pills. ? Estrogen. ? Some blood pressure medicines.  Eating or drinking certain products.  Doing physical activity. Other things that may trigger a migraine headache include:  Having a menstrual period.  Pregnancy.  Hunger.  Stress.  Not getting enough sleep or getting too much sleep.  Weather changes.  Tiredness (fatigue). What increases the risk?  Being 62-68 years old.  Being female.  Having a family history of migraine headaches.  Being Caucasian.  Having depression or anxiety.  Being very overweight. What are the signs or symptoms?  A throbbing pain. This pain may: ? Happen in any area of the head, such as on one side or both sides. ? Make it hard to do daily activities. ? Get worse with physical activity. ? Get worse around bright lights or loud noises.  Other symptoms may include: ? Feeling sick to  your stomach (nauseous). ? Vomiting. ? Dizziness. ? Being sensitive to bright lights, loud noises, or smells.  Before you get a migraine headache, you may get warning signs (an aura). An aura may include: ? Seeing flashing lights or having blind spots. ? Seeing bright spots, halos, or zigzag lines. ? Having tunnel vision or blurred vision. ? Having numbness or a tingling feeling. ? Having trouble talking. ? Having weak muscles.  Some people have symptoms after a migraine headache (postdromal phase), such as: ? Tiredness. ? Trouble thinking (concentrating). How is this treated?  Taking medicines that: ? Relieve pain. ? Relieve the feeling of being sick to your stomach. ? Prevent migraine headaches.  Treatment may also include: ? Having acupuncture. ? Avoiding foods that bring on migraine headaches. ? Learning ways to control your body functions (biofeedback). ? Therapy to help you know and deal with negative thoughts (cognitive behavioral therapy). Follow these instructions at home: Medicines  Take over-the-counter and prescription medicines only as told by your doctor.  Ask your doctor if the medicine prescribed to you: ? Requires you to avoid driving or using heavy machinery. ? Can cause trouble pooping (constipation). You may need to take these steps to prevent or treat trouble pooping:  Drink enough fluid to keep your pee (urine) pale yellow.  Take over-the-counter or prescription medicines.  Eat foods that are high in fiber. These include beans, whole grains, and fresh fruits and vegetables.  Limit foods that are high in fat and sugar.  These include fried or sweet foods. Lifestyle  Do not drink alcohol.  Do not use any products that contain nicotine or tobacco, such as cigarettes, e-cigarettes, and chewing tobacco. If you need help quitting, ask your doctor.  Get at least 8 hours of sleep every night.  Limit and deal with stress. General instructions       Keep a journal to find out what may bring on your migraine headaches. For example, write down: ? What you eat and drink. ? How much sleep you get. ? Any change in what you eat or drink. ? Any change in your medicines.  If you have a migraine headache: ? Avoid things that make your symptoms worse, such as bright lights. ? It may help to lie down in a dark, quiet room. ? Do not drive or use heavy machinery. ? Ask your doctor what activities are safe for you.  Keep all follow-up visits as told by your doctor. This is important. Contact a doctor if:  You get a migraine headache that is different or worse than others you have had.  You have more than 15 headache days in one month. Get help right away if:  Your migraine headache gets very bad.  Your migraine headache lasts longer than 72 hours.  You have a fever.  You have a stiff neck.  You have trouble seeing.  Your muscles feel weak or like you cannot control them.  You start to lose your balance a lot.  You start to have trouble walking.  You pass out (faint).  You have a seizure. Summary  A migraine headache is a very strong throbbing pain on one side or both sides of your head. These headaches can also cause other symptoms.  This condition may be treated with medicines and changes to your lifestyle.  Keep a journal to find out what may bring on your migraine headaches.  Contact a doctor if you get a migraine headache that is different or worse than others you have had.  Contact your doctor if you have more than 15 headache days in a month. This information is not intended to replace advice given to you by your health care provider. Make sure you discuss any questions you have with your health care provider. Document Revised: 01/16/2019 Document Reviewed: 11/06/2018 Elsevier Patient Education  2020 [employer].

## 2020-09-07 NOTE — Progress Notes (Signed)
Chief Complaint  Patient presents with  . Follow-up    [facility]  . Migraine    pt here for a routine f/u. Pt said she has no new sx     HISTORY OF PRESENT ILLNESS: Today 09/07/20  Krystal Bradley is a 59 y.o. female here today for follow up for migraines. She was started on Ajovy at last visit with Dr Vickey Huger. She also continues nortriptyline and zonisamide. Butalbital used sparingly for abortive therapy. She feels Ajovy has helped. She may have 4-5 headache days per month. She can not remember the last time she had a migraine. She uses butalbital 2-3 times a month but does not know that they were full migraines. She occasionally uses robaxin for tension headaches. She is feeling well today and without complaints.    HISTORY (copied from previous note)  Krystal Bradley a 59 y.o. year old- Caucasian female patientis seen here for transfer of Migraine careon 05/31/2020.  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  She has for many years followed Krystal Bradley as her headache specialist. She has had migraines for at least 35 years.  Used to be related to menstrual cycle.  Dr. [name] has moved to [place], she is looking for continuation of her care.  We have the last visit was not been taking available here which stated that the patient was given Ubrelvy samples.  That she cannot have triptans because of a history of a post viral cardiomyopathy and CHF.  She retained fluid in 2011 and learnt to her surprise that she had CHF- She is followed by Dr. [name] at [facility].  She has regular echocardiograms which she states have been normal.  He does have a history of hearing loss headaches insomnia anxiety and the feeling that she does not get enough sleep..  I would like to add that the drug dependent or unrelieved medication did cause her to feel nauseated but she did not get relief in terms of  headaches.  She has never been on any of the injectable medications CRP such as Ajovy, Emgality, Aimovig.  I have the pleasure of seeing Krystal Bradley today,a right -handed White or Caucasian female with a headache and sleep disorder. 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  The patient never had a sleep study.  Sleeprelevant medical history: Insonmia.Familymedical /sleep history:NO  other family member with OSA, insomnia, sleep walkers.mother died when she was young, father had atrial fibrillation, died of pancreatic cancer.  Social history:Patient is working as a Surveyor, mining  and lives in a household with her spouse,- with adult children.  The patient currently works in office, day time. Pets are present. One cat.  Tobacco use- never. ETOH use:  Beer 1-2 / weekly , Caffeine intake in form of Coffee( 1 cup in AM) Soda( /) Tea ( / ) nor energy drinks.Regular exercise: none   Hobbies :DIY projects.    Sleep habits are as follows:The patient's dinner time is between 7 PM. The patient goes to bed at 10 PM and takes AMBIEN every night -continues to sleep for 6-7 hours, wakes rarely for bathroom breaks.   The preferred sleep position is laterally, with the support of one pillow. Dreams  are reportedly frequent/vivid.  5.30 AM is the usual rise time. The patient wakes up with an alarm.  She reports  feeling refreshed - restored in AM, with symptoms such as dry mouth ,some morning headaches , dull- not nauseated. and residual fatigue.  Naps are taken infrequently.     REVIEW OF SYSTEMS: Out of a complete 14 system review of symptoms, the patient complains only of the following symptoms, headaches, anxiety, neck pain and all other  reviewed systems are negative.   ALLERGIES: Allergies  Allergen Reactions  . Ibuprofen Hives  . Penicillins Hives and Other (See Comments)    Has patient had a PCN reaction causing immediate rash, facial/tongue/throat swelling, SOB or lightheadedness with hypotension: No Has patient had a PCN reaction causing severe rash involving mucus membranes or skin necrosis: No Has patient had a PCN reaction that required hospitalization No Has patient had a PCN reaction occurring within the last 10 years: No If all of the above answers are "NO", then may proceed with Cephalosporin use.  . Cephalosporins Hives     HOME MEDICATIONS: Outpatient Medications Prior to Visit  Medication Sig Dispense Refill  . acetaminophen (TYLENOL) 500 MG tablet Take 500-1,000 mg by mouth every 6 (six) hours as needed for mild pain or moderate pain.    . butalbital-acetaminophen-caffeine (FIORICET) 50-325-40 MG tablet Take 1 tablet by mouth every 6 (six) hours as needed for headache. 14 tablet 2  . carvedilol (COREG) 6.25 MG tablet TAKE 1 TABLET BY MOUTH TWICE DAILY WITH A MEAL 60 tablet 5  . DULoxetine (CYMBALTA) 60 MG capsule Take 60 mg by mouth daily.    . furosemide (LASIX) 20 MG tablet TAKE 1 TABLET BY MOUTH DAILY AS NEEDED FOR SWELLING OR SHORTNESS OF BREATH 90 tablet 3  . levonorgestrel (MIRENA) 20 MCG/24HR IUD 1 each by Intrauterine route once.    Marland Kitchen LORazepam (ATIVAN) 2 MG tablet Take 2 mg by mouth 2 (two) times daily as needed.     . ramipril (ALTACE) 5 MG capsule TAKE 1 CAPSULE(5 MG) BY MOUTH DAILY 30 capsule 5  . valACYclovir (VALTREX) 500 MG tablet Take 500 mg by mouth at bedtime.    . Vitamin D, Ergocalciferol, (DRISDOL) 1.25 MG (50000 UNIT) CAPS capsule Take 50,000 Units by mouth once a week.    . zolpidem (AMBIEN CR) 12.5 MG CR tablet Take 12.5 mg by mouth at bedtime as needed for sleep.     Marland Kitchen zonisamide (ZONEGRAN) 100 MG capsule Take 3 capsules (300 mg total) by mouth at bedtime. 270 capsule 6  .  Fremanezumab-vfrm (AJOVY) 225 MG/1.5ML SOAJ Inject into the skin.    . methocarbamol (ROBAXIN) 500 MG tablet Take 500 mg by mouth as needed.    . nortriptyline (PAMELOR) 25 MG capsule Take 1 capsule (25 mg total) by mouth at bedtime. 30 capsule 2  . Erenumab-aooe (AIMOVIG) 140 MG/ML SOAJ Inject 140 mg into the skin every 30 (thirty) days. 1 mL 11  . FLUoxetine (PROZAC) 20 MG capsule Take 20 mg by mouth at bedtime.     No facility-administered medications prior to visit.     PAST MEDICAL HISTORY: Past Medical History:  Diagnosis Date  . CHF (congestive heart failure) (HCC)   . Complication of anesthesia   . Depression   . Dilated cardiomyopathy [facility]) dx Feb 2011   02/10/10 echo EF 20%, 11/09/10 echo 45%  . GERD (gastroesophageal reflux disease)   . Herpes   . Migraines   . PONV (postoperative  nausea and vomiting)   . Ureteral stone      PAST SURGICAL HISTORY: Past Surgical History:  Procedure Laterality Date  . CARDIAC CATHETERIZATION    . CESAREAN SECTION    . Cold knife conization    . CYSTOSCOPY/RETROGRADE/URETEROSCOPY/STONE EXTRACTION WITH BASKET Left 12/17/2016   Procedure: CYSTOSCOPY/RETROGRADE/URETEROSCOPY/STONE EXTRACTION WITH BASKET;  Surgeon: Marcine Matar, MD;  Location: [facility];  Service: Urology;  Laterality: Left;  . DILATION AND CURETTAGE OF UTERUS    . ECTOPIC PREGNANCY SURGERY    . Mirena     Inserted 10-2009  . Tonsillectomy    . TUBAL LIGATION       FAMILY HISTORY: Family History  Problem Relation Age of Onset  . Cancer Mother        Ureter  . Cancer Father        Colon,Gallbladder  . Heart disease Father        Atrial fibrillation  . Cancer Brother        Colon     SOCIAL HISTORY: Social History   Socioeconomic History  . Marital status: Married    Spouse name: Not on file  . Number of children: 2  . Years of education: Not on file  . Highest education level: Not on file  Occupational History    Employer: [employer]  Tobacco Use  . Smoking status: Never Smoker  . Smokeless tobacco: Never Used  Vaping Use  . Vaping Use: Never used  Substance and Sexual Activity  . Alcohol use: Yes    Alcohol/week: 0.0 standard drinks    Comment: Occasional  . Drug use: No  . Sexual activity: Yes    Birth control/protection: I.U.D., Surgical    Comment: Inserted 10-2009  BTL  Other Topics Concern  . Not on file  Social History Narrative   She is married and has 2 children.  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   Social Determinants of Health   Financial Resource Strain:   . Difficulty of Paying Living Expenses: Not on file  Food Insecurity:   . Worried About Programme researcher, broadcasting/film/video in the Last Year: Not on file  . Ran Out of Food in the Last Year: Not on file  Transportation Needs:   . Lack of Transportation (Medical): Not on file  . Lack of Transportation (Non-Medical): Not on file  Physical Activity:   . Days of Exercise per Week: Not on file  . Minutes of Exercise per Session: Not on file  Stress:   . Feeling of Stress : Not on file  Social Connections:   . Frequency of Communication with Friends and Family: Not on file  . Frequency of Social Gatherings with Friends and Family: Not on file  . Attends Religious Services: Not on file  . Active Member of Clubs or Organizations: Not on file  . Attends Banker Meetings: Not on file  . Marital Status: Not on file  Intimate Partner Violence:   . Fear of Current or Ex-Partner: Not on file  . Emotionally Abused: Not on file  . Physically Abused: Not on file  . Sexually Abused: Not on file      PHYSICAL EXAM  Vitals:   09/07/20 1250  BP: 118/69  Pulse: 79  Weight: 147 lb (66.7 kg)  Height: 5\' 4"  (1.626 m)   Body mass index is 25.23 kg/m.   Generalized: Well developed, in no acute distress  Cardiology: normal rate  and rhythm, no murmur auscultated  Respiratory: clear to auscultation bilaterally     Neurological examination  Mentation: Alert oriented to time, place, history taking. Follows all commands speech and language fluent Cranial nerve II-XII: Pupils were equal round reactive to light. Extraocular movements were full, visual field were full  Motor: The motor testing reveals 5 over 5 strength of all 4 extremities. Good symmetric motor tone is noted throughout.  Gait and station: Gait is normal.     DIAGNOSTIC DATA (LABS, IMAGING, TESTING) - I reviewed patient records, labs, notes, testing and imaging myself where available.  Lab Results  Component Value Date   WBC 14.7 (H) 12/17/2016   HGB 12.3 12/17/2016   HCT 35.9 (L) 12/17/2016   MCV 87.8 12/17/2016   PLT 173 12/17/2016      Component Value Date/Time   NA 137 12/17/2016 1526   K 3.9 12/17/2016 1526   CL 109 12/17/2016 1526   CO2 22 12/17/2016 1526   GLUCOSE 110 (H) 12/17/2016 1526   BUN 20 12/17/2016 1526   CREATININE 0.93 12/17/2016 1526   CREATININE 0.79 08/19/2014 1551   CALCIUM 8.8 (L) 12/17/2016 1526   PROT 7.1 08/19/2014 1551   ALBUMIN 4.6 08/19/2014 1551   AST 21 08/19/2014 1551   ALT 15 08/19/2014 1551   ALKPHOS 64 08/19/2014 1551   BILITOT 0.4 08/19/2014 1551   GFRNONAA >60 12/17/2016 1526   GFRAA >60 12/17/2016 1526   Lab Results  Component Value Date   CHOL 221 (H) 08/19/2014   HDL 57 08/19/2014   LDLCALC 138 (H) 08/19/2014   TRIG 128 08/19/2014   CHOLHDL 3.9 08/19/2014   No results found for: HGBA1C No results found for: VITAMINB12 Lab Results  Component Value Date   TSH 1.306 08/19/2014      ASSESSMENT AND PLAN  59 y.o. year old female  has a past medical history of CHF (congestive heart failure) (HCC), Complication of anesthesia, Depression, Dilated cardiomyopathy (HCC) (dx Feb 2011), GERD (gastroesophageal reflux disease), Herpes, Migraines, PONV (postoperative nausea and vomiting), and Ureteral stone. here with   Migraine without aura and without status migrainosus, not  intractable  Krystal Bradley is doing very well since starting Ajovy injections every month.  She reports that headaches are easily aborted with butalbital or Robaxin.  We will continue Ajovy, nortriptyline and zonisamide for preventative therapy.  She will use butalbital and methocarbamol sparingly for abortive therapy.  Healthy lifestyle habits encouraged.  Regular follow-up with primary care advised.  She will follow-up with [place] in 1 year, sooner if needed.  She verbalizes understanding and agreement with this plan.  I spent 20 minutes of face-to-face and non-face-to-face time with patient.  This included previsit chart review, lab review, study review, order entry, electronic health record documentation, patient education.    Krystal Dapper, MSN, FNP-C 09/07/2020, 1:17 PM  [facility] [address] [place], [place] [address] [phone]

## 2020-09-08 ENCOUNTER — Other Ambulatory Visit: Payer: Self-pay | Admitting: *Deleted

## 2020-09-08 MED ORDER — METHOCARBAMOL 500 MG PO TABS
ORAL_TABLET | ORAL | 5 refills | Status: DC
Start: 2020-09-08 — End: 2023-09-11

## 2020-09-08 NOTE — Telephone Encounter (Signed)
I have never prescribed robaxin for this patient -  Is that something we took over or is her PCP prescribing it?

## 2020-09-08 NOTE — Telephone Encounter (Signed)
Received request for robaxin needing directions to process, frequency and how pt to be using the med and day supply limitation. I did not see previous prescription with specific instructions.  Please change or let me know and I will change on rx and send off.

## 2020-09-13 ENCOUNTER — Telehealth: Payer: Self-pay | Admitting: Family Medicine

## 2020-09-13 NOTE — Telephone Encounter (Signed)
[facility] [name]) called, need directions for methocarbamol (ROBAXIN) 500 MG tablet. Would like call from the nurse.

## 2020-09-13 NOTE — Telephone Encounter (Signed)
Called pharmacy and they have the second prescription with instructions. (did not see the second one).  Nothing needed.

## 2020-09-16 DIAGNOSIS — M79604 Pain in right leg: Secondary | ICD-10-CM | POA: Diagnosis not present

## 2020-09-16 DIAGNOSIS — M545 Low back pain, unspecified: Secondary | ICD-10-CM | POA: Diagnosis not present

## 2020-09-18 ENCOUNTER — Encounter: Payer: Self-pay | Admitting: Family Medicine

## 2020-09-19 ENCOUNTER — Other Ambulatory Visit: Payer: Self-pay

## 2020-09-19 ENCOUNTER — Telehealth: Payer: Self-pay | Admitting: *Deleted

## 2020-09-19 ENCOUNTER — Encounter: Payer: Self-pay | Admitting: Cardiology

## 2020-09-19 ENCOUNTER — Ambulatory Visit: Payer: BC Managed Care – PPO | Admitting: Cardiology

## 2020-09-19 VITALS — BP 138/86 | HR 76 | Ht 64.0 in | Wt 148.0 lb

## 2020-09-19 DIAGNOSIS — I5040 Unspecified combined systolic (congestive) and diastolic (congestive) heart failure: Secondary | ICD-10-CM

## 2020-09-19 DIAGNOSIS — I428 Other cardiomyopathies: Secondary | ICD-10-CM

## 2020-09-19 NOTE — Telephone Encounter (Signed)
I dont see that we have prescribed this for her?

## 2020-09-19 NOTE — Telephone Encounter (Signed)
Patient requesting refill of ambien. Per patient her previous neurologist prescribed, Dr Monica Becton. Per [place] [facility] registry it was refilled x 6 months on  03/21/20 by Dr Mal Amabile, psychiatrist, previously refilled by Dr Sharene Skeans.  Called [facility], spoke with Trinna Post who stated  Dr Sherrie Mustache sent refill in March, 30 day supply picked up by patient. Dr Annia Belt sent Rx in May  but it was never picked up. Patient is on Lorazepam prescribed by Dr Sherrie Mustache and Lynden Ang NP.   Dr Rodolph Bong prescribed Gabapentin 300 mg on 12/10, one cap at bedtime.  Per [place] drug registry, [facility] has refilled [place] since June Rx.  [facility], spoke with Lawson Fiscal. Patient picked up ambien monthly from them beginning 7/8/ 2020 - 08/22/20. Zolpidiem XR 12.5 mg ,1 tab at bedtime as needed. The prescribing providers are Dellie Catholic, [place]; Dr Annia Belt, neurology;  Mal Amabile, psychiatry.

## 2020-09-19 NOTE — Patient Instructions (Signed)

## 2020-09-22 MED ORDER — CARVEDILOL 12.5 MG PO TABS
12.5000 mg | ORAL_TABLET | Freq: Two times a day (BID) | ORAL | 3 refills | Status: DC
Start: 2020-09-22 — End: 2021-10-10

## 2020-09-22 NOTE — Telephone Encounter (Signed)
I don't like to continue her on Ambien, and she should give Trazodone, Amitrpyiline a choice. Chronic insomnia is treated by cognitive behavior therapy , not in sleep clinic.   I could not direct this note to [facility], Dr Nadene Rubins, but would like her input as well.

## 2020-09-22 NOTE — Telephone Encounter (Signed)
This patient has failed: imitrex before cardiomyopathy: Topamax, beta blocker/ propanolol, Verapamil, Depakote. Nurtec and Ubrelvy.   1) Migraine in form of retroorbital throbbing , nausea, photophobia. But no visual aura. Now down to 2-5 migraines a month. Still having a headache 3-4 times a week.  At least 15 days /month.   2) viral cardiomyopathy with CHF history- not a candidate for triptan.   3) insomnia chronic, not an organic sleep disorder. No apnea or RLS.   4) Headaches have sometimes woken her- these are stabbing , sharp, orbital - cluster- type. These sleep HA are not chronic.   My Plan is to proceed with:  1) Migraine referral; continue on maintenance medication - per Dr [name]. ZONEGRAN, PAMELOR, ROBAXIN prn.   Acute headaches responded to Butalbutal.  Use sparingly for cluster at night.  2) preventive injectible Ajovy-  Will give first doe here.

## 2020-10-05 DIAGNOSIS — M5416 Radiculopathy, lumbar region: Secondary | ICD-10-CM | POA: Diagnosis not present

## 2020-10-14 DIAGNOSIS — M5416 Radiculopathy, lumbar region: Secondary | ICD-10-CM | POA: Diagnosis not present

## 2020-10-25 DIAGNOSIS — I5022 Chronic systolic (congestive) heart failure: Secondary | ICD-10-CM | POA: Diagnosis not present

## 2020-11-04 DIAGNOSIS — H2513 Age-related nuclear cataract, bilateral: Secondary | ICD-10-CM | POA: Diagnosis not present

## 2020-11-04 DIAGNOSIS — H04123 Dry eye syndrome of bilateral lacrimal glands: Secondary | ICD-10-CM | POA: Diagnosis not present

## 2020-11-04 DIAGNOSIS — H16143 Punctate keratitis, bilateral: Secondary | ICD-10-CM | POA: Diagnosis not present

## 2021-01-16 ENCOUNTER — Other Ambulatory Visit: Payer: Self-pay | Admitting: Cardiology

## 2021-01-30 DIAGNOSIS — I428 Other cardiomyopathies: Secondary | ICD-10-CM

## 2021-01-30 DIAGNOSIS — R0602 Shortness of breath: Secondary | ICD-10-CM

## 2021-01-30 NOTE — Telephone Encounter (Signed)
Repeat echocardiogram, ask patient to take Lasix 20 mg daily instead of as needed, check potassium, renal function and BNP in 1 week. Add patient to my schedule in the next 2 to 4 weeks.  Olga Millers

## 2021-01-30 NOTE — Telephone Encounter (Signed)
Spoke with pt, she will increase the furosemide to 40 mg daily and let us know by the end of the week if she does not feel better. Aware okay for echo 5/23 unless her symptoms change and she will need to let us know. Follow up scheduled with dr [name] after echo complete.

## 2021-02-07 DIAGNOSIS — I429 Cardiomyopathy, unspecified: Secondary | ICD-10-CM | POA: Diagnosis not present

## 2021-02-07 DIAGNOSIS — I5020 Unspecified systolic (congestive) heart failure: Secondary | ICD-10-CM | POA: Diagnosis not present

## 2021-02-07 DIAGNOSIS — I428 Other cardiomyopathies: Secondary | ICD-10-CM | POA: Diagnosis not present

## 2021-02-09 ENCOUNTER — Encounter: Payer: Self-pay | Admitting: Family Medicine

## 2021-02-09 MED ORDER — TRAZODONE HCL 50 MG PO TABS: 100.0000 mg | ORAL_TABLET | Freq: Every day | ORAL | 1 refills | Status: DC

## 2021-02-13 ENCOUNTER — Encounter: Payer: Self-pay | Admitting: *Deleted

## 2021-02-20 ENCOUNTER — Encounter: Payer: Self-pay | Admitting: *Deleted

## 2021-02-27 ENCOUNTER — Ambulatory Visit (HOSPITAL_COMMUNITY): Payer: BC Managed Care – PPO | Attending: Cardiovascular Disease

## 2021-02-27 ENCOUNTER — Other Ambulatory Visit: Payer: Self-pay

## 2021-02-27 DIAGNOSIS — R0602 Shortness of breath: Secondary | ICD-10-CM | POA: Diagnosis not present

## 2021-02-27 DIAGNOSIS — I428 Other cardiomyopathies: Secondary | ICD-10-CM

## 2021-02-27 LAB — ECHOCARDIOGRAM COMPLETE
Area-P 1/2: 3.34 cm2
S' Lateral: 2.3 cm

## 2021-02-28 NOTE — Progress Notes (Signed)
HPI: FU cardiomyopathy and congestive heart failure. Echocardiogram in February of 2011 showed an ejection fraction of 20-25%, mild biatrial enlargement, mild mitral regurgitation and moderate tricuspid regurgitation. There is a small pericardial effusion. Cardiac catheterization in 2011 showed an ejection fraction of 15% and normal coronary arteries. Patient was treated medically. Echocardiogram May 2022 showed ejection fraction 45 to 50% similar to previous.  There was grade 1 diastolic dysfunction, trace mitral regurgitation.  Since I last saw her,there is no increased dyspnea, orthopnea, PND, pedal edema or syncope.  Occasional sharp pain in her chest for 1 to 2 seconds but no exertional symptoms.  Current Outpatient Medications  Medication Sig Dispense Refill  . acetaminophen (TYLENOL) 500 MG tablet Take 500-1,000 mg by mouth every 6 (six) hours as needed for mild pain or moderate pain.    . bifidobacterium infantis (ALIGN) capsule Take by mouth daily.    . butalbital-acetaminophen-caffeine (FIORICET) 50-325-40 MG tablet Take 1 tablet by mouth every 6 (six) hours as needed for headache. 14 tablet 2  . carvedilol (COREG) 12.5 MG tablet Take 1 tablet (12.5 mg total) by mouth 2 (two) times daily with a meal. 180 tablet 3  . DULoxetine (CYMBALTA) 60 MG capsule Take 60 mg by mouth daily.    . famotidine (PEPCID) 20 MG tablet Take 20 mg by mouth as needed.    . furosemide (LASIX) 40 MG tablet Take 1 tablet (40 mg total) by mouth daily. 90 tablet 3  . levonorgestrel (MIRENA) 20 MCG/24HR IUD 1 each by Intrauterine route once.    Marland Kitchen LORazepam (ATIVAN) 2 MG tablet Take 2 mg by mouth 2 (two) times daily as needed.     . methocarbamol (ROBAXIN) 500 MG tablet As need , not to exceed one a day po 30 tablet 5  . nortriptyline (PAMELOR) 25 MG capsule Take 1 capsule (25 mg total) by mouth at bedtime. 90 capsule 3  . ramipril (ALTACE) 5 MG capsule TAKE 1 CAPSULE(5 MG) BY MOUTH DAILY 30 capsule 5  .  traZODone (DESYREL) 50 MG tablet Take 2 tablets (100 mg total) by mouth at bedtime. 180 tablet 1  . valACYclovir (VALTREX) 500 MG tablet Take 500 mg by mouth at bedtime.    Marland Kitchen zonisamide (ZONEGRAN) 100 MG capsule Take 3 capsules (300 mg total) by mouth at bedtime. 270 capsule 6   No current facility-administered medications for this visit.     Past Medical History:  Diagnosis Date  . CHF (congestive heart failure) (HCC)   . Complication of anesthesia   . Depression   . Dilated cardiomyopathy [facility]) dx Feb 2011   02/10/10 echo EF 20%, 11/09/10 echo 45%  . GERD (gastroesophageal reflux disease)   . Herpes   . Migraines   . PONV (postoperative nausea and vomiting)   . Ureteral stone     Past Surgical History:  Procedure Laterality Date  . CARDIAC CATHETERIZATION    . CESAREAN SECTION    . Cold knife conization    . CYSTOSCOPY/RETROGRADE/URETEROSCOPY/STONE EXTRACTION WITH BASKET Left 12/17/2016   Procedure: CYSTOSCOPY/RETROGRADE/URETEROSCOPY/STONE EXTRACTION WITH BASKET;  Surgeon: Marcine Matar, MD;  Location: [facility];  Service: Urology;  Laterality: Left;  . DILATION AND CURETTAGE OF UTERUS    . ECTOPIC PREGNANCY SURGERY    . Mirena     Inserted 10-2009  . Tonsillectomy    . TUBAL LIGATION      Social History   Socioeconomic History  . Marital status: Married    Spouse  name: Not on file  . Number of children: 2  . Years of education: Not on file  . Highest education level: Not on file  Occupational History    Employer: [employer]  Tobacco Use  . Smoking status: Never Smoker  . Smokeless tobacco: Never Used  Vaping Use  . Vaping Use: Never used  Substance and Sexual Activity  . Alcohol use: Yes    Alcohol/week: 0.0 standard drinks    Comment: Occasional  . Drug use: No  . Sexual activity: Yes    Birth control/protection: I.U.D., Surgical    Comment: Inserted 10-2009  BTL  Other Topics Concern  . Not on file  Social History Narrative   She is  married and has 2 children.  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   Social Determinants of Health   Financial Resource Strain: Not on file  Food Insecurity: Not on file  Transportation Needs: Not on file  Physical Activity: Not on file  Stress: Not on file  Social Connections: Not on file  Intimate Partner Violence: Not on file    Family History  Problem Relation Age of Onset  . Cancer Mother        Ureter  . Cancer Father        Colon,Gallbladder  . Heart disease Father        Atrial fibrillation  . Cancer Brother        Colon    ROS: no fevers or chills, productive cough, hemoptysis, dysphasia, odynophagia, melena, hematochezia, dysuria, hematuria, rash, seizure activity, orthopnea, PND, pedal edema, claudication. Remaining systems are negative.  Physical Exam: Well-developed well-nourished in no acute distress.  Skin is warm and dry.  HEENT is normal.  Neck is supple.  Chest is clear to auscultation with normal expansion.  Cardiovascular exam is regular rate and rhythm.  Abdominal exam nontender or distended. No masses palpated. Extremities show no edema. neuro grossly intact  ECG-normal sinus rhythm at a rate of 65, nonspecific ST changes.  Personally reviewed  A/P  1 nonischemic cardiomyopathy-LV function mildly reduced on most recent echocardiogram similar to previous.  Continue ACE inhibitor and beta-blocker.  She is not significantly volume overloaded on examination.  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  Continue fluid restriction and low-sodium diet.  Olga Millers, MD

## 2021-03-05 DIAGNOSIS — R601 Generalized edema: Secondary | ICD-10-CM

## 2021-03-07 MED ORDER — FUROSEMIDE 40 MG PO TABS: 40.0000 mg | ORAL_TABLET | Freq: Every day | ORAL | 3 refills | Status: DC

## 2021-03-14 ENCOUNTER — Other Ambulatory Visit: Payer: Self-pay

## 2021-03-14 ENCOUNTER — Encounter: Payer: Self-pay | Admitting: Cardiology

## 2021-03-14 ENCOUNTER — Ambulatory Visit: Payer: BC Managed Care – PPO | Admitting: Cardiology

## 2021-03-14 VITALS — BP 104/78 | HR 65 | Ht 64.0 in | Wt 157.0 lb

## 2021-03-14 DIAGNOSIS — I428 Other cardiomyopathies: Secondary | ICD-10-CM | POA: Diagnosis not present

## 2021-03-14 DIAGNOSIS — I5040 Unspecified combined systolic (congestive) and diastolic (congestive) heart failure: Secondary | ICD-10-CM | POA: Diagnosis not present

## 2021-03-14 NOTE — Patient Instructions (Signed)

## 2021-04-25 DIAGNOSIS — H16223 Keratoconjunctivitis sicca, not specified as Sjogren's, bilateral: Secondary | ICD-10-CM | POA: Diagnosis not present

## 2021-05-11 DIAGNOSIS — Z131 Encounter for screening for diabetes mellitus: Secondary | ICD-10-CM | POA: Diagnosis not present

## 2021-05-11 DIAGNOSIS — N1831 Chronic kidney disease, stage 3a: Secondary | ICD-10-CM | POA: Diagnosis not present

## 2021-05-11 DIAGNOSIS — I429 Cardiomyopathy, unspecified: Secondary | ICD-10-CM | POA: Diagnosis not present

## 2021-05-11 DIAGNOSIS — Z23 Encounter for immunization: Secondary | ICD-10-CM | POA: Diagnosis not present

## 2021-05-11 DIAGNOSIS — Z0000 Encounter for general adult medical examination without abnormal findings: Secondary | ICD-10-CM | POA: Diagnosis not present

## 2021-05-11 DIAGNOSIS — Z1331 Encounter for screening for depression: Secondary | ICD-10-CM | POA: Diagnosis not present

## 2021-05-11 DIAGNOSIS — Z1389 Encounter for screening for other disorder: Secondary | ICD-10-CM | POA: Diagnosis not present

## 2021-05-11 DIAGNOSIS — Z79899 Other long term (current) drug therapy: Secondary | ICD-10-CM

## 2021-05-11 DIAGNOSIS — R601 Generalized edema: Secondary | ICD-10-CM

## 2021-05-12 ENCOUNTER — Other Ambulatory Visit: Payer: Self-pay | Admitting: Internal Medicine

## 2021-05-12 DIAGNOSIS — R1901 Right upper quadrant abdominal swelling, mass and lump: Secondary | ICD-10-CM

## 2021-05-12 DIAGNOSIS — R109 Unspecified abdominal pain: Secondary | ICD-10-CM

## 2021-05-12 MED ORDER — FUROSEMIDE 20 MG PO TABS: 20.0000 mg | ORAL_TABLET | Freq: Every day | ORAL | 3 refills | Status: DC

## 2021-05-15 ENCOUNTER — Ambulatory Visit
Admission: RE | Admit: 2021-05-15 | Discharge: 2021-05-15 | Disposition: A | Discharge status: Home / Self Care | Payer: BC Managed Care – PPO | Source: Ambulatory Visit | Attending: Internal Medicine | Admitting: Internal Medicine

## 2021-05-15 DIAGNOSIS — R1011 Right upper quadrant pain: Secondary | ICD-10-CM | POA: Diagnosis not present

## 2021-05-15 DIAGNOSIS — R1901 Right upper quadrant abdominal swelling, mass and lump: Secondary | ICD-10-CM

## 2021-05-15 DIAGNOSIS — R109 Unspecified abdominal pain: Secondary | ICD-10-CM

## 2021-05-15 NOTE — Addendum Note (Signed)
Addended by: Bea Laura B on: 05/15/2021 07:52 AM   Modules accepted: Orders

## 2021-07-24 ENCOUNTER — Other Ambulatory Visit: Payer: Self-pay | Admitting: Neurology

## 2021-07-25 ENCOUNTER — Encounter: Payer: Self-pay | Admitting: *Deleted

## 2021-07-26 ENCOUNTER — Other Ambulatory Visit: Payer: Self-pay | Admitting: *Deleted

## 2021-07-26 MED ORDER — NORTRIPTYLINE HCL 25 MG PO CAPS: 25.0000 mg | ORAL_CAPSULE | Freq: Every day | ORAL | 0 refills | Status: DC

## 2021-08-08 ENCOUNTER — Other Ambulatory Visit: Payer: Self-pay

## 2021-08-08 MED ORDER — TRAZODONE HCL 50 MG PO TABS: 100.0000 mg | ORAL_TABLET | Freq: Every day | ORAL | 0 refills | Status: DC

## 2021-08-23 ENCOUNTER — Other Ambulatory Visit: Payer: Self-pay | Admitting: Cardiology

## 2021-09-11 NOTE — Progress Notes (Signed)
HPI: FU cardiomyopathy and congestive heart failure. Echocardiogram in February of 2011 showed an ejection fraction of 20-25%, mild biatrial enlargement, mild mitral regurgitation and moderate tricuspid regurgitation. There is a small pericardial effusion. Cardiac catheterization in 2011 showed an ejection fraction of 15% and normal coronary arteries. Patient was treated medically. Echocardiogram May 2022 showed ejection fraction 45 to 50% similar to previous.  There was grade 1 diastolic dysfunction, trace mitral regurgitation.  Since I last saw her, there is no dyspnea, chest pain, palpitations or syncope.  Current Outpatient Medications  Medication Sig Dispense Refill   acetaminophen (TYLENOL) 500 MG tablet Take 500-1,000 mg by mouth every 6 (six) hours as needed for mild pain or moderate pain.     bifidobacterium infantis (ALIGN) capsule Take by mouth daily.     butalbital-acetaminophen-caffeine (FIORICET) 50-325-40 MG tablet Take 1 tablet by mouth every 6 (six) hours as needed for headache. 14 tablet 2   carvedilol (COREG) 12.5 MG tablet Take 1 tablet (12.5 mg total) by mouth 2 (two) times daily with a meal. 180 tablet 3   DULoxetine (CYMBALTA) 60 MG capsule Take 60 mg by mouth daily.     famotidine (PEPCID) 20 MG tablet Take 20 mg by mouth as needed.     furosemide (LASIX) 20 MG tablet Take 1 tablet (20 mg total) by mouth daily. 90 tablet 3   levonorgestrel (MIRENA) 20 MCG/24HR IUD 1 each by Intrauterine route once.     LORazepam (ATIVAN) 2 MG tablet Take 2 mg by mouth 2 (two) times daily as needed.      methocarbamol (ROBAXIN) 500 MG tablet As need , not to exceed one a day po 30 tablet 5   nortriptyline (PAMELOR) 25 MG capsule Take 1 capsule (25 mg total) by mouth at bedtime. 90 capsule 3   ramipril (ALTACE) 5 MG capsule TAKE ONE CAPSULE BY MOUTH DAILY 30 capsule 5   traZODone (DESYREL) 50 MG tablet Take 2 tablets (100 mg total) by mouth at bedtime. 180 tablet 3   valACYclovir  (VALTREX) 500 MG tablet Take 500 mg by mouth at bedtime.     zonisamide (ZONEGRAN) 100 MG capsule Take 3 capsules (300 mg total) by mouth at bedtime. 270 capsule 3   No current facility-administered medications for this visit.     Past Medical History:  Diagnosis Date   CHF (congestive heart failure) (HCC)    Complication of anesthesia    Depression    Dilated cardiomyopathy [facility]) dx Feb 2011   02/10/10 echo EF 20%, 11/09/10 echo 45%   GERD (gastroesophageal reflux disease)    Herpes    Migraines    PONV (postoperative nausea and vomiting)    Ureteral stone     Past Surgical History:  Procedure Laterality Date   CARDIAC CATHETERIZATION     CESAREAN SECTION     Cold knife conization     CYSTOSCOPY/RETROGRADE/URETEROSCOPY/STONE EXTRACTION WITH BASKET Left 12/17/2016   Procedure: CYSTOSCOPY/RETROGRADE/URETEROSCOPY/STONE EXTRACTION WITH BASKET;  Surgeon: Franchot Gallo, MD;  Location: [facility];  Service: Urology;  Laterality: Left;   DILATION AND CURETTAGE OF UTERUS     ECTOPIC PREGNANCY SURGERY     Mirena     Inserted 10-2009   Tonsillectomy     TUBAL LIGATION      Social History   Socioeconomic History   Marital status: Married    Spouse name: Not on file   Number of children: 2   Years of education: Not on file  Highest education level: Not on file  Occupational History    Employer: [employer]  Tobacco Use   Smoking status: Never   Smokeless tobacco: Never  Vaping Use   Vaping Use: Never used  Substance and Sexual Activity   Alcohol use: Yes    Alcohol/week: 0.0 standard drinks    Comment: Occasional   Drug use: No   Sexual activity: Yes    Birth control/protection: I.U.D., Surgical    Comment: Inserted 10-2009  BTL  Other Topics Concern   Not on file  Social History Narrative   She is married and has 2 children.  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   Social Determinants of Health   Financial Resource Strain: Not on  file  Food Insecurity: Not on file  Transportation Needs: Not on file  Physical Activity: Not on file  Stress: Not on file  Social Connections: Not on file  Intimate Partner Violence: Not on file    Family History  Problem Relation Age of Onset   Cancer Mother        Ureter   Cancer Father        Colon,Gallbladder   Heart disease Father        Atrial fibrillation   Cancer Brother        Colon    ROS: no fevers or chills, productive cough, hemoptysis, dysphasia, odynophagia, melena, hematochezia, dysuria, hematuria, rash, seizure activity, orthopnea, PND, pedal edema, claudication. Remaining systems are negative.  Physical Exam: Well-developed well-nourished in no acute distress.  Skin is warm and dry.  HEENT is normal.  Neck is supple.  Chest is clear to auscultation with normal expansion.  Cardiovascular exam is regular rate and rhythm.  Abdominal exam nontender or distended. No masses palpated. Extremities show no edema. neuro grossly intact   A/P  1 nonischemic cardiomyopathy-LV function mildly reduced on most recent echocardiogram with ejection fraction 45 to 50%.  We will continue ACE inhibitor and beta-blocker.  Note she is not volume overloaded on physical examination.  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  Continue fluid restriction and low-sodium diet.  Check potassium and renal function.  Olga Millers, MD

## 2021-09-13 ENCOUNTER — Ambulatory Visit (INDEPENDENT_AMBULATORY_CARE_PROVIDER_SITE_OTHER): Payer: BC Managed Care – PPO | Admitting: Family Medicine

## 2021-09-13 DIAGNOSIS — F5104 Psychophysiologic insomnia: Secondary | ICD-10-CM

## 2021-09-13 DIAGNOSIS — G43009 Migraine without aura, not intractable, without status migrainosus: Secondary | ICD-10-CM

## 2021-09-13 NOTE — Patient Instructions (Signed)
Below is our plan:  We will continue nortriptyline 25mg , zonisamide 300mg  and trazodone 100mg  daily. Please continue OTC analgesics for headache abortion.   Please make sure you are staying well hydrated. I recommend 50-60 ounces daily. Well balanced diet and regular exercise encouraged. Consistent sleep schedule with 6-8 hours recommended.   Please continue follow up with care team as directed.   Follow up with me in 1 year   You may receive a survey regarding today's visit. I encourage you to leave honest feed back as I do use this information to improve patient care. Thank you for seeing me today!

## 2021-09-13 NOTE — Progress Notes (Signed)
No chief complaint on file.    HISTORY OF PRESENT ILLNESS: 09/13/2021 ALL: Krystal Bradley returns for follow up for migraines and insomnia. She has continued trazodone 100mg  QHS, nortriptyline 25mg  QHS and zonisamide 300mg  daily. She did not continue Ajovy due to whelping of skin with injections. She reports doing well. She may have a couple of mild headaches every month. She is able to take OTC analgesics for abortive therapy. She is sleeping well. She is happy with current regimen.   09/07/2020 ALL:  Krystal Bradley is a 60 y.o. female here today for follow up for migraines. She was started on Ajovy at last visit with Dr 14/10/2019. She also continues nortriptyline and zonisamide. Butalbital used sparingly for abortive therapy. She feels Ajovy has helped. She may have 4-5 headache days per month. She can not remember the last time she had a migraine. She uses butalbital 2-3 times a month but does not know that they were full migraines. She occasionally uses robaxin for tension headaches. She is feeling well today and without complaints.    HISTORY (copied from previous note)  Krystal Bradley is a 60 y.o. year old- Caucasian female patient is seen here for transfer of Migraine care on 05/31/2020.    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  She has for many years followed Mikki Santee as her headache specialist. She has had migraines for at least 35 years.  Used to be related to menstrual cycle.  Dr. Charity fundraiser has moved to [place], she is looking for continuation of her care.  We have the last visit was not been taking available here which stated that the patient was given Ubrelvy samples.  That she cannot have triptans because of a history of a post viral cardiomyopathy and CHF.  She retained fluid in 2011 and learnt to her surprise that she had CHF- She is followed by Dr. [name] at [facility].  She has regular  echocardiograms which she states have been normal.  He does have a history of hearing loss headaches insomnia anxiety and the feeling that she does not get enough sleep..  I would like to add that the drug dependent or unrelieved medication did cause her to feel nauseated but she did not get relief in terms of headaches.  She has never been on any of the injectable medications CRP such as Ajovy, Emgality, Aimovig.   I have the pleasure of seeing Krystal Bradley today, a right -handed White or Caucasian female with a headache and sleep disorder.  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   The patient never had a sleep study.   Sleep relevant medical history: Insonmia.  Family medical /sleep history:NO  other family member with OSA, insomnia, sleep walkers. mother died when she was young, father had atrial fibrillation, died of pancreatic cancer.   Social history: Patient is working as a [employer]  and lives in a household with her spouse,- with adult children.  The patient currently works in office, day time. Pets are present. One cat.  Tobacco use- never. ETOH use:  Beer 1-2 / weekly , Caffeine intake in form of Coffee( 1 cup in AM) Soda( /) Tea ( / ) nor energy  drinks.Regular exercise: none   Hobbies :DIY projects.    Sleep habits are as follows: The patient's dinner time is between 7 PM. The patient goes to bed at 10 PM and takes AMBIEN every night -continues to sleep for 6-7 hours, wakes rarely for bathroom breaks.   The preferred sleep position is laterally, with the support of one pillow. Dreams are reportedly frequent/vivid.  5.30 AM is the usual rise time. The patient wakes up with an alarm.  She reports  feeling refreshed - restored in AM, with  symptoms such as dry mouth ,some morning headaches , dull- not nauseated. and residual fatigue.  Naps are taken infrequently.  REVIEW OF SYSTEMS: Out of a complete 14 system review of symptoms, the patient complains only of the following symptoms, headaches, anxiety, neck pain and all other reviewed systems are negative.   ALLERGIES: Allergies  Allergen Reactions   Ibuprofen Hives   Penicillins Hives and Other (See Comments)    Has patient had a PCN reaction causing immediate rash, facial/tongue/throat swelling, SOB or lightheadedness with hypotension: No Has patient had a PCN reaction causing severe rash involving mucus membranes or skin necrosis: No Has patient had a PCN reaction that required hospitalization No Has patient had a PCN reaction occurring within the last 10 years: No If all of the above answers are "NO", then may proceed with Cephalosporin use.   Cephalosporins Hives     HOME MEDICATIONS: Outpatient Medications Prior to Visit  Medication Sig Dispense Refill   acetaminophen (TYLENOL) 500 MG tablet Take 500-1,000 mg by mouth every 6 (six) hours as needed for mild pain or moderate pain.     bifidobacterium infantis (ALIGN) capsule Take by mouth daily.     butalbital-acetaminophen-caffeine (FIORICET) 50-325-40 MG tablet Take 1 tablet by mouth every 6 (six) hours as needed for headache. 14 tablet 2   carvedilol (COREG) 12.5 MG tablet Take 1 tablet (12.5 mg total) by mouth 2 (two) times daily with a meal. 180 tablet 3   DULoxetine (CYMBALTA) 60 MG capsule Take 60 mg by mouth daily.     famotidine (PEPCID) 20 MG tablet Take 20 mg by mouth as needed.     furosemide (LASIX) 20 MG tablet Take 1 tablet (20 mg total) by mouth daily. 90 tablet 3   levonorgestrel (MIRENA) 20 MCG/24HR IUD 1 each by Intrauterine route once.     LORazepam (ATIVAN) 2 MG tablet Take 2 mg by mouth 2 (two) times daily as needed.      methocarbamol (ROBAXIN) 500 MG tablet As need , not to exceed one a day  po 30 tablet 5   ramipril (ALTACE) 5 MG capsule TAKE ONE CAPSULE BY MOUTH DAILY 30 capsule 5   valACYclovir (VALTREX) 500 MG tablet Take 500 mg by mouth at bedtime.     nortriptyline (PAMELOR) 25 MG capsule Take 1 capsule (25 mg total) by mouth at bedtime. 90 capsule 0   traZODone (DESYREL) 50 MG tablet Take 2 tablets (100 mg total) by mouth at bedtime. Please keep upcoming appt for further refills 180 tablet 0   zonisamide (ZONEGRAN) 100 MG capsule Take 3 capsules (300 mg total) by mouth at bedtime. PLEASE KEEP UPCOMING APPT FOR FURTHER REFILLS 270 capsule 0   No facility-administered medications prior to visit.     PAST MEDICAL HISTORY: Past Medical History:  Diagnosis Date   CHF (congestive heart failure) (HCC)    Complication of anesthesia    Depression    Dilated cardiomyopathy (HCC)  dx Feb 2011   02/10/10 echo EF 20%, 11/09/10 echo 45%   GERD (gastroesophageal reflux disease)    Herpes    Migraines    PONV (postoperative nausea and vomiting)    Ureteral stone      PAST SURGICAL HISTORY: Past Surgical History:  Procedure Laterality Date   CARDIAC CATHETERIZATION     CESAREAN SECTION     Cold knife conization     CYSTOSCOPY/RETROGRADE/URETEROSCOPY/STONE EXTRACTION WITH BASKET Left 12/17/2016   Procedure: CYSTOSCOPY/RETROGRADE/URETEROSCOPY/STONE EXTRACTION WITH BASKET;  Surgeon: Marcine Matar, MD;  Location: [facility];  Service: Urology;  Laterality: Left;   DILATION AND CURETTAGE OF UTERUS     ECTOPIC PREGNANCY SURGERY     Mirena     Inserted 10-2009   Tonsillectomy     TUBAL LIGATION       FAMILY HISTORY: Family History  Problem Relation Age of Onset   Cancer Mother        Ureter   Cancer Father        Colon,Gallbladder   Heart disease Father        Atrial fibrillation   Cancer Brother        Colon     SOCIAL HISTORY: Social History   Socioeconomic History   Marital status: Married    Spouse name: Not on file   Number of children: 2   Years of  education: Not on file   Highest education level: Not on file  Occupational History    Employer: [employer]  Tobacco Use   Smoking status: Never   Smokeless tobacco: Never  Vaping Use   Vaping Use: Never used  Substance and Sexual Activity   Alcohol use: Yes    Alcohol/week: 0.0 standard drinks    Comment: Occasional   Drug use: No   Sexual activity: Yes    Birth control/protection: I.U.D., Surgical    Comment: Inserted 10-2009  BTL  Other Topics Concern   Not on file  Social History Narrative   She is married and has 2 children.  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   Social Determinants of Health   Financial Resource Strain: Not on file  Food Insecurity: Not on file  Transportation Needs: Not on file  Physical Activity: Not on file  Stress: Not on file  Social Connections: Not on file  Intimate Partner Violence: Not on file      PHYSICAL EXAM  There were no vitals filed for this visit.  There is no height or weight on file to calculate BMI.   Generalized: Well developed, in no acute distress  Cardiology: normal rate and rhythm, no murmur auscultated  Respiratory: clear to auscultation bilaterally    Neurological examination  Mentation: Alert oriented to time, place, history taking. Follows all commands speech and language fluent Cranial nerve II-XII: Pupils were equal round reactive to light. Extraocular movements were full, visual field were full  Motor: The motor testing reveals 5 over 5 strength of all 4 extremities. Good symmetric motor tone is noted throughout.  Gait and station: Gait is normal.     DIAGNOSTIC DATA (LABS, IMAGING, TESTING) - I reviewed patient records, labs, notes, testing and imaging myself where available.  Lab Results  Component Value Date   WBC 14.7 (H) 12/17/2016   HGB 12.3 12/17/2016   HCT 35.9 (L) 12/17/2016   MCV 87.8 12/17/2016   PLT 173 12/17/2016      Component Value Date/Time  NA 137 12/17/2016 1526   K 3.9 12/17/2016 1526   CL 109 12/17/2016 1526   CO2 22 12/17/2016 1526   GLUCOSE 110 (H) 12/17/2016 1526   BUN 20 12/17/2016 1526   CREATININE 0.93 12/17/2016 1526   CREATININE 0.79 08/19/2014 1551   CALCIUM 8.8 (L) 12/17/2016 1526   PROT 7.1 08/19/2014 1551   ALBUMIN 4.6 08/19/2014 1551   AST 21 08/19/2014 1551   ALT 15 08/19/2014 1551   ALKPHOS 64 08/19/2014 1551   BILITOT 0.4 08/19/2014 1551   GFRNONAA >60 12/17/2016 1526   GFRAA >60 12/17/2016 1526   Lab Results  Component Value Date   CHOL 221 (H) 08/19/2014   HDL 57 08/19/2014   LDLCALC 138 (H) 08/19/2014   TRIG 128 08/19/2014   CHOLHDL 3.9 08/19/2014   No results found for: HGBA1C No results found for: VITAMINB12 Lab Results  Component Value Date   TSH 1.306 08/19/2014      ASSESSMENT AND PLAN  60 y.o. year old female  has a past medical history of CHF (congestive heart failure) (HCC), Complication of anesthesia, Depression, Dilated cardiomyopathy (HCC) (dx Feb 2011), GERD (gastroesophageal reflux disease), Herpes, Migraines, PONV (postoperative nausea and vomiting), and Ureteral stone. here with   Migraine without aura and without status migrainosus, not intractable  Psychophysiologic insomnia  Krisha is doing very well since starting Ajovy injections every month.  She reports that headaches are easily aborted with butalbital or Robaxin.  We will continue trazodone 100mg  QHS for sleep and nortriptyline 25mg  daily and zonisamide 300mg  daily for migraines preventative therapy.  She will use OTC analgesics for abortive therapy. She has not needed methocarbamol or butalbital. Healthy lifestyle habits encouraged.  Regular follow-up with primary care advised.  She will follow-up with in 1 year, sooner if needed.  She verbalizes understanding and agreement with this plan.  , MSN, FNP-C 09/14/2021, 8:40 AM  [facility] [address], [address] [place], [facility]  [address] [phone]

## 2021-09-14 ENCOUNTER — Encounter: Payer: Self-pay | Admitting: Family Medicine

## 2021-09-14 ENCOUNTER — Other Ambulatory Visit: Payer: Self-pay

## 2021-09-14 MED ORDER — ZONISAMIDE 100 MG PO CAPS
300.0000 mg | ORAL_CAPSULE | Freq: Every day | ORAL | 3 refills | Status: DC
Start: 2021-09-14 — End: 2022-09-19

## 2021-09-14 MED ORDER — TRAZODONE HCL 50 MG PO TABS: 100.0000 mg | ORAL_TABLET | Freq: Every day | ORAL | 3 refills | Status: DC

## 2021-09-14 MED ORDER — NORTRIPTYLINE HCL 25 MG PO CAPS: 25.0000 mg | ORAL_CAPSULE | Freq: Every day | ORAL | 3 refills | Status: DC

## 2021-09-18 ENCOUNTER — Ambulatory Visit (INDEPENDENT_AMBULATORY_CARE_PROVIDER_SITE_OTHER): Payer: BC Managed Care – PPO | Admitting: Cardiology

## 2021-09-18 ENCOUNTER — Encounter: Payer: Self-pay | Admitting: Cardiology

## 2021-09-18 ENCOUNTER — Other Ambulatory Visit: Payer: Self-pay

## 2021-09-18 VITALS — BP 118/72 | HR 91 | Ht 64.0 in | Wt 149.6 lb

## 2021-09-18 DIAGNOSIS — I428 Other cardiomyopathies: Secondary | ICD-10-CM

## 2021-09-18 DIAGNOSIS — I5040 Unspecified combined systolic (congestive) and diastolic (congestive) heart failure: Secondary | ICD-10-CM | POA: Diagnosis not present

## 2021-09-18 NOTE — Patient Instructions (Signed)

## 2021-09-19 DIAGNOSIS — Z1231 Encounter for screening mammogram for malignant neoplasm of breast: Secondary | ICD-10-CM | POA: Diagnosis not present

## 2021-09-19 LAB — BASIC METABOLIC PANEL
BUN/Creatinine Ratio: 20 (ref 9–23)
BUN: 24 mg/dL (ref 6–24)
CO2: 22 mmol/L (ref 20–29)
Calcium: 9.6 mg/dL (ref 8.7–10.2)
Chloride: 102 mmol/L (ref 96–106)
Creatinine, Ser: 1.22 mg/dL — ABNORMAL HIGH (ref 0.57–1.00)
Glucose: 106 mg/dL — ABNORMAL HIGH (ref 70–99)
Potassium: 4 mmol/L (ref 3.5–5.2)
Sodium: 140 mmol/L (ref 134–144)
eGFR: 51 mL/min/{1.73_m2} — ABNORMAL LOW (ref >59)

## 2021-10-07 ENCOUNTER — Other Ambulatory Visit: Payer: Self-pay | Admitting: Cardiology

## 2021-10-21 ENCOUNTER — Other Ambulatory Visit: Payer: Self-pay | Admitting: Neurology

## 2022-02-14 ENCOUNTER — Other Ambulatory Visit: Payer: Self-pay | Admitting: Cardiology

## 2022-05-18 ENCOUNTER — Other Ambulatory Visit: Payer: Self-pay | Admitting: Cardiology

## 2022-05-21 ENCOUNTER — Other Ambulatory Visit: Payer: Self-pay | Admitting: Cardiology

## 2022-05-21 DIAGNOSIS — R601 Generalized edema: Secondary | ICD-10-CM

## 2022-09-18 NOTE — Patient Instructions (Signed)
Below is our plan:  We will continue current treatment plan.  Please make sure you are staying well hydrated. I recommend 50-60 ounces daily. Well balanced diet and regular exercise encouraged. Consistent sleep schedule with 6-8 hours recommended.   Please continue follow up with care team as directed.   Follow up with me in 1 year   You may receive a survey regarding today's visit. I encourage you to leave honest feed back as I do use this information to improve patient care. Thank you for seeing me today!    

## 2022-09-18 NOTE — Progress Notes (Unsigned)
No chief complaint on file.   HISTORY OF PRESENT ILLNESS:  09/19/2022 ALL: Krystal Bradley returns for follow up for migraines and insomnia. She continues trazodone 100mg  QHS, nortriptyline 25mg  QHS and zonisamide 300mg  daily.   09/13/2021 ALL: Krystal Bradley returns for follow up for migraines and insomnia. She has continued trazodone 100mg  QHS, nortriptyline 25mg  QHS and zonisamide 300mg  daily. She did not continue Ajovy due to whelping of skin with injections. She reports doing well. She may have a couple of mild headaches every month. She is able to take OTC analgesics for abortive therapy. She is sleeping well. She is happy with current regimen.   09/07/2020 ALL:  Krystal Bradley is a 61 y.o. female here today for follow up for migraines. She was started on Ajovy at last visit with Dr Vickey Huger. She also continues nortriptyline and zonisamide. Butalbital used sparingly for abortive therapy. She feels Ajovy has helped. She may have 4-5 headache days per month. She can not remember the last time she had a migraine. She uses butalbital 2-3 times a month but does not know that they were full migraines. She occasionally uses robaxin for tension headaches. She is feeling well today and without complaints.    HISTORY (copied from previous note)  Krystal Bradley is a 61 y.o. year old- Caucasian female patient is seen here for transfer of Migraine care on 05/31/2020.    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  She has for many years followed [name] as her headache specialist. She has had migraines for at least 35 years.  Used to be related to menstrual cycle.  Dr. [name] has moved to [place], she is looking for continuation of her care.  We have the last visit was not been taking available here which stated that the patient was given Ubrelvy samples.  That she cannot have triptans because of a history of a post  viral cardiomyopathy and CHF.  She retained fluid in 2011 and learnt to her surprise that she had CHF- She is followed by Dr. [name] at [facility] heart.  She has regular echocardiograms which she states have been normal.  He does have a history of hearing loss headaches insomnia anxiety and the feeling that she does not get enough sleep..  I would like to add that the drug dependent or unrelieved medication did cause her to feel nauseated but she did not get relief in terms of headaches.  She has never been on any of the injectable medications CRP such as Ajovy, Emgality, Aimovig.   I have the pleasure of seeing Krystal Bradley today, a right -handed White or Caucasian female with a headache and sleep disorder.  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   The patient never had a sleep study.   Sleep relevant medical history: Insonmia.  Family medical /sleep history:NO  other family member with OSA, insomnia, sleep walkers. mother died when she was young, father had atrial fibrillation, died of pancreatic cancer.   Social history: Patient is working as a Surveyor, mining  and lives in a household with her spouse,- with adult children.  The patient currently works in office, day time. Pets are present. One cat.  Tobacco use- never. ETOH  use:  Beer 1-2 / weekly , Caffeine intake in form of Coffee( 1 cup in AM) Soda( /) Tea ( / ) nor energy drinks.Regular exercise: none   Hobbies :DIY projects.    Sleep habits are as follows: The patient's dinner time is between 7 PM. The patient goes to bed at 10 PM and takes AMBIEN every night -continues to sleep for 6-7 hours, wakes rarely for bathroom breaks.   The preferred sleep position is laterally, with the support of one  pillow. Dreams are reportedly frequent/vivid.  5.30 AM is the usual rise time. The patient wakes up with an alarm.  She reports  feeling refreshed - restored in AM, with symptoms such as dry mouth ,some morning headaches , dull- not nauseated. and residual fatigue.  Naps are taken infrequently.  REVIEW OF SYSTEMS: Out of a complete 14 system review of symptoms, the patient complains only of the following symptoms, headaches, anxiety, neck pain and all other reviewed systems are negative.   ALLERGIES: Allergies  Allergen Reactions   Ibuprofen Hives   Penicillins Hives and Other (See Comments)    Has patient had a PCN reaction causing immediate rash, facial/tongue/throat swelling, SOB or lightheadedness with hypotension: No Has patient had a PCN reaction causing severe rash involving mucus membranes or skin necrosis: No Has patient had a PCN reaction that required hospitalization No Has patient had a PCN reaction occurring within the last 10 years: No If all of the above answers are "NO", then may proceed with Cephalosporin use.   Cephalosporins Hives     HOME MEDICATIONS: Outpatient Medications Prior to Visit  Medication Sig Dispense Refill   acetaminophen (TYLENOL) 500 MG tablet Take 500-1,000 mg by mouth every 6 (six) hours as needed for mild pain or moderate pain.     bifidobacterium infantis (ALIGN) capsule Take by mouth daily.     butalbital-acetaminophen-caffeine (FIORICET) 50-325-40 MG tablet Take 1 tablet by mouth every 6 (six) hours as needed for headache. 14 tablet 2   carvedilol (COREG) 12.5 MG tablet TAKE ONE TABLET BY MOUTH TWICE A DAY WITH A MEAL 180 tablet 3   dapagliflozin propanediol (FARXIGA) 10 MG TABS tablet Take by mouth daily.     DULoxetine (CYMBALTA) 60 MG capsule Take 60 mg by mouth daily.     famotidine (PEPCID) 20 MG tablet Take 20 mg by mouth as needed.     furosemide (LASIX) 20 MG tablet Take 1 tablet (20 mg total) by mouth daily. 90 tablet 1    levonorgestrel (MIRENA) 20 MCG/24HR IUD 1 each by Intrauterine route once.     LORazepam (ATIVAN) 2 MG tablet Take 2 mg by mouth 2 (two) times daily as needed.      methocarbamol (ROBAXIN) 500 MG tablet As need , not to exceed one a day po 30 tablet 5   nortriptyline (PAMELOR) 25 MG capsule Take 1 capsule (25 mg total) by mouth at bedtime. 90 capsule 3   ramipril (ALTACE) 5 MG capsule TAKE ONE CAPSULE BY MOUTH DAILY 30 capsule 5   traZODone (DESYREL) 50 MG tablet Take 2 tablets (100 mg total) by mouth at bedtime. 180 tablet 3   valACYclovir (VALTREX) 500 MG tablet Take 500 mg by mouth at bedtime.     zonisamide (ZONEGRAN) 100 MG capsule Take 3 capsules (300 mg total) by mouth at bedtime. 270 capsule 3   No facility-administered medications prior to visit.     PAST MEDICAL HISTORY: Past Medical History:  Diagnosis Date  CHF (congestive heart failure) (HCC)    Complication of anesthesia    Depression    Dilated cardiomyopathy [facility]) dx Feb 2011   02/10/10 echo EF 20%, 11/09/10 echo 45%   GERD (gastroesophageal reflux disease)    Herpes    Migraines    PONV (postoperative nausea and vomiting)    Ureteral stone      PAST SURGICAL HISTORY: Past Surgical History:  Procedure Laterality Date   CARDIAC CATHETERIZATION     CESAREAN SECTION     Cold knife conization     CYSTOSCOPY/RETROGRADE/URETEROSCOPY/STONE EXTRACTION WITH BASKET Left 12/17/2016   Procedure: CYSTOSCOPY/RETROGRADE/URETEROSCOPY/STONE EXTRACTION WITH BASKET;  Surgeon: Marcine Matar, MD;  Location: [facility];  Service: Urology;  Laterality: Left;   DILATION AND CURETTAGE OF UTERUS     ECTOPIC PREGNANCY SURGERY     Mirena     Inserted 10-2009   Tonsillectomy     TUBAL LIGATION       FAMILY HISTORY: Family History  Problem Relation Age of Onset   Cancer Mother        Ureter   Cancer Father        Colon,Gallbladder   Heart disease Father        Atrial fibrillation   Cancer Brother        Colon     SOCIAL  HISTORY: Social History   Socioeconomic History   Marital status: Married    Spouse name: Not on file   Number of children: 2   Years of education: Not on file   Highest education level: Not on file  Occupational History    Employer: [employer]  Tobacco Use   Smoking status: Never   Smokeless tobacco: Never  Vaping Use   Vaping Use: Never used  Substance and Sexual Activity   Alcohol use: Yes    Alcohol/week: 0.0 standard drinks of alcohol    Comment: Occasional   Drug use: No   Sexual activity: Yes    Birth control/protection: I.U.D., Surgical    Comment: Inserted 10-2009  BTL  Other Topics Concern   Not on file  Social History Narrative   She is married and has 2 children.  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   Social Determinants of Health   Financial Resource Strain: Not on file  Food Insecurity: Not on file  Transportation Needs: Not on file  Physical Activity: Not on file  Stress: Not on file  Social Connections: Not on file  Intimate Partner Violence: Not on file      PHYSICAL EXAM  There were no vitals filed for this visit.  There is no height or weight on file to calculate BMI.   Generalized: Well developed, in no acute distress  Cardiology: normal rate and rhythm, no murmur auscultated  Respiratory: clear to auscultation bilaterally    Neurological examination  Mentation: Alert oriented to time, place, history taking. Follows all commands speech and language fluent Cranial nerve II-XII: Pupils were equal round reactive to light. Extraocular movements were full, visual field were full  Motor: The motor testing reveals 5 over 5 strength of all 4 extremities. Good symmetric motor tone is noted throughout.  Gait and station: Gait is normal.     DIAGNOSTIC DATA (LABS, IMAGING, TESTING) - I reviewed patient records, labs, notes, testing and imaging myself where available.  Lab Results  Component Value Date    WBC 14.7 (H) 12/17/2016   HGB 12.3 12/17/2016   HCT  35.9 (L) 12/17/2016   MCV 87.8 12/17/2016   PLT 173 12/17/2016      Component Value Date/Time   NA 140 09/18/2021 1431   K 4.0 09/18/2021 1431   CL 102 09/18/2021 1431   CO2 22 09/18/2021 1431   GLUCOSE 106 (H) 09/18/2021 1431   GLUCOSE 110 (H) 12/17/2016 1526   BUN 24 09/18/2021 1431   CREATININE 1.22 (H) 09/18/2021 1431   CREATININE 0.79 08/19/2014 1551   CALCIUM 9.6 09/18/2021 1431   PROT 7.1 08/19/2014 1551   ALBUMIN 4.6 08/19/2014 1551   AST 21 08/19/2014 1551   ALT 15 08/19/2014 1551   ALKPHOS 64 08/19/2014 1551   BILITOT 0.4 08/19/2014 1551   GFRNONAA >60 12/17/2016 1526   GFRAA >60 12/17/2016 1526   Lab Results  Component Value Date   CHOL 221 (H) 08/19/2014   HDL 57 08/19/2014   LDLCALC 138 (H) 08/19/2014   TRIG 128 08/19/2014   CHOLHDL 3.9 08/19/2014   No results found for: "HGBA1C" No results found for: "VITAMINB12" Lab Results  Component Value Date   TSH 1.306 08/19/2014      ASSESSMENT AND PLAN  61 y.o. year old female  has a past medical history of CHF (congestive heart failure) (HCC), Complication of anesthesia, Depression, Dilated cardiomyopathy (HCC) (dx Feb 2011), GERD (gastroesophageal reflux disease), Herpes, Migraines, PONV (postoperative nausea and vomiting), and Ureteral stone. here with   No diagnosis found.  Enda is doing very well since starting Ajovy injections every month.  She reports that headaches are easily aborted with butalbital or Robaxin.  We will continue trazodone 100mg  QHS for sleep and nortriptyline 25mg  daily and zonisamide 300mg  daily for migraines preventative therapy.  She will use OTC analgesics for abortive therapy. She has not needed methocarbamol or butalbital. Healthy lifestyle habits encouraged.  Regular follow-up with primary care advised.  She will follow-up with in 1 year, sooner if needed.  She verbalizes understanding and agreement with this plan.  , MSN, FNP-C 09/18/2022, 9:00 AM  [facility] [address], [address] [place], 14/09/2022 [address] [phone]

## 2022-09-19 ENCOUNTER — Encounter: Payer: Self-pay | Admitting: Family Medicine

## 2022-09-19 ENCOUNTER — Ambulatory Visit: Payer: 59 | Admitting: Family Medicine

## 2022-09-19 VITALS — BP 107/71 | HR 80 | Ht 64.0 in

## 2022-09-19 DIAGNOSIS — G43009 Migraine without aura, not intractable, without status migrainosus: Secondary | ICD-10-CM | POA: Diagnosis not present

## 2022-09-19 DIAGNOSIS — F5104 Psychophysiologic insomnia: Secondary | ICD-10-CM | POA: Diagnosis not present

## 2022-09-19 MED ORDER — TRAZODONE HCL 50 MG PO TABS: 100.0000 mg | ORAL_TABLET | Freq: Every day | ORAL | 3 refills | Status: DC

## 2022-09-19 MED ORDER — NORTRIPTYLINE HCL 25 MG PO CAPS: 25.0000 mg | ORAL_CAPSULE | Freq: Every day | ORAL | 3 refills | Status: DC

## 2022-09-19 MED ORDER — ZONISAMIDE 100 MG PO CAPS: 300.0000 mg | ORAL_CAPSULE | Freq: Every day | ORAL | 3 refills | Status: DC

## 2022-09-27 ENCOUNTER — Other Ambulatory Visit: Payer: Self-pay | Admitting: Cardiology

## 2022-10-10 ENCOUNTER — Other Ambulatory Visit: Payer: Self-pay | Admitting: Cardiology

## 2022-10-24 ENCOUNTER — Other Ambulatory Visit: Payer: Self-pay | Admitting: Cardiology

## 2022-11-04 NOTE — Progress Notes (Unsigned)
Cardiology Clinic Note   Patient Name: Krystal Bradley Date of Encounter: 11/07/2022  Primary Care Provider:  Georgann Housekeeper, MD Primary Cardiologist:  Olga Millers, MD  Patient Profile    Krystal Bradley is a 62 y.o. female with a past medical history of nonischemic cardiomyopathy, chronic combined systolic and diastolic heart failure who presents to the clinic today for 1 year follow-up for chronic cardiac conditions.  Past Medical History    Past Medical History:  Diagnosis Date   CHF (congestive heart failure) (HCC)    Complication of anesthesia    Depression    Dilated cardiomyopathy (HCC) dx Feb 2011   02/10/10 echo EF 20%, 11/09/10 echo 45%   GERD (gastroesophageal reflux disease)    Herpes    Migraines    PONV (postoperative nausea and vomiting)    Ureteral stone    Past Surgical History:  Procedure Laterality Date   CARDIAC CATHETERIZATION     CESAREAN SECTION     Cold knife conization     CYSTOSCOPY/RETROGRADE/URETEROSCOPY/STONE EXTRACTION WITH BASKET Left 12/17/2016   Procedure: CYSTOSCOPY/RETROGRADE/URETEROSCOPY/STONE EXTRACTION WITH BASKET;  Surgeon: Marcine Matar, MD;  Location: [facility];  Service: Urology;  Laterality: Left;   DILATION AND CURETTAGE OF UTERUS     ECTOPIC PREGNANCY SURGERY     Mirena     Inserted 10-2009   Tonsillectomy     TUBAL LIGATION      Allergies  Allergies  Allergen Reactions   Ibuprofen Hives   Penicillins Hives and Other (See Comments)    Has patient had a PCN reaction causing immediate rash, facial/tongue/throat swelling, SOB or lightheadedness with hypotension: No Has patient had a PCN reaction causing severe rash involving mucus membranes or skin necrosis: No Has patient had a PCN reaction that required hospitalization No Has patient had a PCN reaction occurring within the last 10 years: No If all of the above answers are "NO", then may proceed with Cephalosporin use.   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 Echo 02/27/2021: EF 45 to 50%.  Regional wall motion abnormalities.  Moderate asymmetric LVH of the basal and septal segments.  Grade I DD.  Trivial MR.  Mild calcification/sclerosis of aortic valve without stenosis. Unchanged from previous. Hyperlipidemia.  Lipid panel 06/27/2022: LDL 148, HDL 57, TG 119, total 226.  Ms. Slight was first evaluated by Dr. [name] on 01/30/2012 for cardiomyopathy and congestive heart failure.  She had previously been receiving her care from [facility] and wanted to establish in [place].  She had an echo February 2011 which showed EF 20 to 25%, mild by atrial enlargement, mild MR and moderate TR.  LHC in 2011 showed EF 15% and normal coronary arteries.  She was treated medically for her heart failure.  In February 2012 and echo showed improvement of LV function with an EF of 45%.  Was last seen in the office by Dr. [name] on 09/18/2021.  At that time she was doing well and all medications were continued.  Today, patient is doing well. Patient denies shortness of breath or dyspnea on exertion. No chest pain, pressure, or tightness. Denies lower extremity edema, orthopnea, or PND. No palpitations. 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 She restricts her sodium intake. She weights a few times a week and reports it is stable  Home Medications    Current Meds  Medication Sig   acetaminophen (TYLENOL) 500 MG tablet Take 500-1,000 mg by mouth every 6 (six) hours as needed for mild pain or moderate pain.   atorvastatin (LIPITOR) 20 MG tablet Take 1 tablet (20 mg total) by mouth daily.   bifidobacterium infantis (ALIGN) capsule Take by mouth daily.   butalbital-acetaminophen-caffeine (FIORICET) 50-325-40 MG tablet Take 1 tablet by mouth every 6 (six) hours as needed for headache.   carvedilol  (COREG) 12.5 MG tablet Take 1 tablet (12.5 mg total) by mouth 2 (two) times daily with a meal.   dapagliflozin propanediol (FARXIGA) 10 MG TABS tablet Take by mouth daily.   DULoxetine (CYMBALTA) 60 MG capsule Take 60 mg by mouth daily.   famotidine (PEPCID) 20 MG tablet Take 20 mg by mouth as needed.   furosemide (LASIX) 20 MG tablet Take 1 tablet (20 mg total) by mouth daily.   levonorgestrel (MIRENA) 20 MCG/24HR IUD 1 each by Intrauterine route once.   LORazepam (ATIVAN) 2 MG tablet Take 2 mg by mouth 2 (two) times daily as needed.    methocarbamol (ROBAXIN) 500 MG tablet As need , not to exceed one a day po   nortriptyline (PAMELOR) 25 MG capsule Take 1 capsule (25 mg total) by mouth at bedtime.   ramipril (ALTACE) 5 MG capsule TAKE ONE CAPSULE BY MOUTH DAILY   traZODone (DESYREL) 50 MG tablet Take 2 tablets (100 mg total) by mouth at bedtime.   valACYclovir (VALTREX) 500 MG tablet Take 500 mg by mouth at bedtime.   zonisamide (ZONEGRAN) 100 MG capsule Take 3 capsules (300 mg total) by mouth at bedtime.    Family History    Family History  Problem Relation Age of Onset   Cancer Mother        Ureter   Cancer Father        Colon,Gallbladder   Heart disease Father        Atrial fibrillation   Cancer Brother        Colon   She indicated that her mother is deceased. She indicated that her father is deceased. She indicated that the status of her brother is unknown. She indicated that her maternal grandmother is deceased. She indicated that her maternal grandfather is deceased. She indicated that her paternal grandmother is deceased. She indicated that her paternal grandfather is deceased.   Social History    Social History   Socioeconomic History   Marital status: Married    Spouse name: Not on file   Number of children: 2   Years of education: Not on file   Highest education level: Not on file  Occupational History    Employer: [employer]  Tobacco Use    Smoking status: Never   Smokeless tobacco: Never  Vaping Use   Vaping Use: Never used  Substance and Sexual Activity   Alcohol use: Yes    Alcohol/week: 0.0 standard drinks of alcohol    Comment: Occasional   Drug use: No   Sexual activity: Yes    Birth control/protection: I.U.D., Surgical    Comment: Inserted 10-2009  BTL  Other Topics Concern   Not on file  Social History Narrative   She is married and has 2 children.  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   Social Determinants of Health   Financial Resource Strain: Not on file  Food Insecurity: Not on file  Transportation Needs: Not on file  Physical Activity: Not on file  Stress: Not on file  Social Connections: Not on file  Intimate Partner Violence: Not on file     Review of Systems    General:  No chills, fever, night sweats or weight changes.  Cardiovascular:  No chest pain, dyspnea on exertion, edema, orthopnea, palpitations, paroxysmal nocturnal dyspnea. Dermatological: No rash, lesions/masses Respiratory: No cough, dyspnea Urologic: No hematuria, dysuria Abdominal:   No nausea, vomiting, diarrhea, bright red blood per rectum, melena, or hematemesis Neurologic:  No visual changes, weakness, changes in mental status. All other systems reviewed and are otherwise negative except as noted above.  Physical Exam    VS:  BP 104/62   Pulse 72   Ht 5\' 4"  (1.626 m)   Wt 151 lb (68.5 kg)   LMP  (LMP Unknown)   SpO2 94%   BMI 25.92 kg/m  , BMI Body mass index is 25.92 kg/m. GEN: Well nourished, well developed, in no acute distress. HEENT: Normal. Neck: Supple, no JVD, carotid bruits, or masses. Cardiac: RRR, no murmurs, rubs, or gallops. No clubbing, cyanosis, edema.  Radials/DP/PT 2+ and equal bilaterally.  Respiratory:  Respirations regular and unlabored, clear to auscultation bilaterally. GI: Soft, nontender, nondistended. MS: No deformity or atrophy. Skin: Warm and dry, no rash. Neuro:  Strength and sensation are intact. Psych: Normal affect.  Accessory Clinical Findings    Recent Labs: From [facility] 06/27/2022: A1c: 5.4. Potassium 3.9. Creatinine 0.920. ALT 15.0.  Recent Lipid Panel Lipid panel [facility] 06/27/2022: LDL 148, HDL 57, TG 119, total 226.   ECG personally reviewed by me today: NSR, rate 72 bpm, nonspecific ST and T wave abnormality.  No significant changes from 03/14/2021.   Assessment & Plan   Chronic combined systolic and diastolic heart failure/nonischemic cardiomyopathy.  Echo May 2022 showed EF 45 to 50%, Grade I DD.  Patient denies DOE, lower extremity edema, orthopnea, or PND. She restricts sodium intake.  Euvolemic and well compensated on exam. Continue carvedilol, furosemide, ramipril, Farxiga. Will get repeat Echo.  Hyperlipidemia. LDL September 2023 148, not at goal. Will start Lipitor 20 mg nightlu. Return in 8-10 weeks for repeat lipid panel and LFTs.   Disposition: Repeat Echo. Start Lipitor 20 mg nightly. Repeat lipid panel and LFTs 8-10 weeks. Return in 1 year or sooner as needed.    Justice Britain. Ramyah Pankowski, DNP, NP-C     11/07/2022, 3:56 PM [place] [address] Office [phone] Fax [phone]

## 2022-11-07 ENCOUNTER — Ambulatory Visit: Payer: 59 | Attending: General Practice | Admitting: Student

## 2022-11-07 ENCOUNTER — Other Ambulatory Visit: Payer: Self-pay | Admitting: Cardiology

## 2022-11-07 ENCOUNTER — Encounter: Payer: Self-pay | Admitting: General Practice

## 2022-11-07 VITALS — BP 104/62 | HR 72 | Ht 64.0 in | Wt 151.0 lb

## 2022-11-07 DIAGNOSIS — I5040 Unspecified combined systolic (congestive) and diastolic (congestive) heart failure: Secondary | ICD-10-CM | POA: Diagnosis not present

## 2022-11-07 DIAGNOSIS — E785 Hyperlipidemia, unspecified: Secondary | ICD-10-CM | POA: Diagnosis not present

## 2022-11-07 DIAGNOSIS — I428 Other cardiomyopathies: Secondary | ICD-10-CM

## 2022-11-07 MED ORDER — ATORVASTATIN CALCIUM 20 MG PO TABS: 20.0000 mg | ORAL_TABLET | Freq: Every day | ORAL | 3 refills | Status: DC

## 2022-11-07 NOTE — Patient Instructions (Signed)
Medication Instructions:  START ATORVASTATIN 20MG  DAILY *If you need a refill on your cardiac medications before your next appointment, please call your pharmacy*  Lab Work: FASTING LIPID AND LFT IN 8-10 WEEKS If you have labs (blood work) drawn today and your tests are completely normal, you will receive your results only by: [place] (if you have MyChart) OR A paper copy in the mail If you have any lab test that is abnormal or we need to change your treatment, we will call you to review the results.  Testing/Procedures: Echocardiogram - Your physician has requested that you have an echocardiogram. Echocardiography is a painless test that uses sound waves to create images of your heart. It provides your doctor with information about the size and shape of your heart and how well your heart's chambers and valves are working. This procedure takes approximately one hour. There are no restrictions for this procedure.   Follow-Up: At [facility], you and your health needs are our priority.  As part of our continuing mission to provide you with exceptional heart care, we have created designated Provider Care Teams.  These Care Teams include your primary Cardiologist (physician) and Advanced Practice Providers (APPs -  Physician Assistants and Nurse Practitioners) who all work together to provide you with the care you need, when you need it.  Your next appointment:   12 month(s)  Provider:   Kirk Ruths, MD  or Mayra Reel, NP        Other Instructions

## 2022-11-08 NOTE — Telephone Encounter (Signed)
FYI: patient wanted Dr. Stanford Breed to know that she has always been on farxiga 5mg  daily, not 10mg  daily. Is this dose okay, so I can update her med list?

## 2022-11-09 MED ORDER — DAPAGLIFLOZIN PROPANEDIOL 10 MG PO TABS: 10.0000 mg | ORAL_TABLET | Freq: Every day | ORAL | 11 refills | Status: DC

## 2022-11-09 NOTE — Addendum Note (Signed)
Addended by: Fidel Levy on: 11/09/2022 03:36 PM   Modules accepted: Orders

## 2022-11-18 ENCOUNTER — Other Ambulatory Visit: Payer: Self-pay | Admitting: Cardiology

## 2022-11-26 ENCOUNTER — Other Ambulatory Visit: Payer: Self-pay | Admitting: Cardiology

## 2022-11-26 DIAGNOSIS — R601 Generalized edema: Secondary | ICD-10-CM

## 2022-11-29 ENCOUNTER — Ambulatory Visit (HOSPITAL_COMMUNITY): Payer: 59 | Attending: Student

## 2022-11-29 DIAGNOSIS — I5040 Unspecified combined systolic (congestive) and diastolic (congestive) heart failure: Secondary | ICD-10-CM | POA: Insufficient documentation

## 2022-11-29 LAB — ECHOCARDIOGRAM COMPLETE
Area-P 1/2: 3.07 cm2
S' Lateral: 2.9 cm

## 2023-03-03 ENCOUNTER — Other Ambulatory Visit: Payer: Self-pay | Admitting: Student

## 2023-03-05 ENCOUNTER — Other Ambulatory Visit: Payer: Self-pay

## 2023-03-05 MED ORDER — ATORVASTATIN CALCIUM 20 MG PO TABS: 20.0000 mg | ORAL_TABLET | Freq: Every day | ORAL | 7 refills | Status: DC

## 2023-03-05 NOTE — Telephone Encounter (Signed)
Pt's medication was resent to pt's pharmacy with enough refills. Confirmation received.

## 2023-05-05 ENCOUNTER — Other Ambulatory Visit: Payer: Self-pay | Admitting: Cardiology

## 2023-05-21 ENCOUNTER — Other Ambulatory Visit: Payer: Self-pay | Admitting: Cardiology

## 2023-05-21 DIAGNOSIS — R601 Generalized edema: Secondary | ICD-10-CM

## 2023-09-10 NOTE — Progress Notes (Unsigned)
No chief complaint on file.   HISTORY OF PRESENT ILLNESS:  09/19/2022 ALL: Krystal Bradley returns for follow up for migraines and insomnia. She was last seen 09/2022 and doing well. Since, She continues trazodone 100mg  QHS, nortriptyline 25mg  QHS and zonisamide 300mg  daily. Butalbital and methocarbamol previously used for abortive therapy but not used, recently.   09/19/2022 ALL:  Krystal Bradley returns for follow up for migraines and insomnia. She continues trazodone 100mg  QHS, nortriptyline 25mg  QHS and zonisamide 300mg  daily. She reports doing well. Headaches are very well managed. She can't remember the last bad headache she had. She is sleeping well. She is tolerating medications well with no obvious adverse effects.   09/13/2021 ALL: Krystal Bradley returns for follow up for migraines and insomnia. She has continued trazodone 100mg  QHS, nortriptyline 25mg  QHS and zonisamide 300mg  daily. She did not continue Ajovy due to whelping of skin with injections. She reports doing well. She may have a couple of mild headaches every month. She is able to take OTC analgesics for abortive therapy. She is sleeping well. She is happy with current regimen.   09/07/2020 ALL:  Krystal Bradley is a 62 y.o. female here today for follow up for migraines. She was started on Ajovy at last visit with Dr Vickey Huger. She also continues nortriptyline and zonisamide. Butalbital used sparingly for abortive therapy. She feels Ajovy has helped. She may have 4-5 headache days per month. She can not remember the last time she had a migraine. She uses butalbital 2-3 times a month but does not know that they were full migraines. She occasionally uses robaxin for tension headaches. She is feeling well today and without complaints.   HISTORY (copied from previous note)  Krystal Bradley is a 62 y.o. year old- Caucasian female patient is seen here for transfer of Migraine care on 05/31/2020.    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  She has for many years followed [name] as her headache specialist. She has had migraines for at least 35 years.  Used to be related to menstrual cycle.  Dr. [name] has moved to [place], she is looking for continuation of her care.  We have the last visit was not been taking available here which stated that the patient was given Ubrelvy samples.  That she cannot have triptans because of a history of a post viral cardiomyopathy and CHF.  She retained fluid in 2011 and learnt to her surprise that she had CHF- She is followed by Dr. [name] at [facility].  She has regular echocardiograms which she states have been normal.  He does have a history of hearing loss headaches insomnia anxiety and the feeling that she does not get enough sleep..  I would like to add that the drug dependent or unrelieved medication did cause her to feel nauseated but she did not get relief in terms of headaches.  She has never been on any of the injectable medications CRP such as Ajovy, Emgality, Aimovig.   I have the pleasure of seeing Krystal Bradley today, a right -handed White or Caucasian female with a headache and sleep disorder.  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   The patient never had a sleep study.  Sleep relevant medical history: Insonmia.  Family medical /sleep history:NO  other family member with OSA, insomnia, sleep walkers. mother died when she was young, father had atrial fibrillation, died of pancreatic cancer.   Social history: Patient is working as a Surveyor, mining  and lives in a household with her spouse,- with adult children.  The patient currently works in office, day  time. Pets are present. One cat.  Tobacco use- never. ETOH use:  Beer 1-2 / weekly , Caffeine intake in form of Coffee( 1 cup in AM) Soda( /) Tea ( / ) nor energy drinks.Regular exercise: none   Hobbies :DIY projects.    Sleep habits are as follows: The patient's dinner time is between 7 PM. The patient goes to bed at 10 PM and takes AMBIEN every night -continues to sleep for 6-7 hours, wakes rarely for bathroom breaks.   The preferred sleep position is laterally, with the support of one pillow. Dreams are reportedly frequent/vivid.  5.30 AM is the usual rise time. The patient wakes up with an alarm.  She reports  feeling refreshed - restored in AM, with symptoms such as dry mouth ,some morning headaches , dull- not nauseated. and residual fatigue.  Naps are taken infrequently.  REVIEW OF SYSTEMS: Out of a complete 14 system review of symptoms, the patient complains only of the following symptoms, headaches, anxiety, neck pain and all other reviewed systems are negative.   ALLERGIES: Allergies  Allergen Reactions   Ibuprofen Hives   Penicillins Hives and Other (See Comments)    Has patient had a PCN reaction causing immediate rash, facial/tongue/throat swelling, SOB or lightheadedness with hypotension: No Has patient had a PCN reaction causing severe rash involving mucus membranes or skin necrosis: No Has patient had a PCN reaction that required hospitalization No Has patient had a PCN reaction occurring within the last 10 years: No If all of the above answers are "NO", then may proceed with Cephalosporin use.   Cephalosporins Hives     HOME MEDICATIONS: Outpatient Medications Prior to Visit  Medication Sig Dispense Refill   acetaminophen (TYLENOL) 500 MG tablet Take 500-1,000 mg by mouth every 6 (six) hours as needed for mild pain or moderate pain.     atorvastatin (LIPITOR) 20 MG tablet Take 1 tablet (20 mg total) by mouth daily. 30 tablet 7   bifidobacterium infantis (ALIGN) capsule  Take by mouth daily.     butalbital-acetaminophen-caffeine (FIORICET) 50-325-40 MG tablet Take 1 tablet by mouth every 6 (six) hours as needed for headache. 14 tablet 2   carvedilol (COREG) 12.5 MG tablet TAKE 1 TABLET BY MOUTH TWICE A DAY WITH A MEAL 90 tablet 3   dapagliflozin propanediol (FARXIGA) 10 MG TABS tablet Take 1 tablet (10 mg total) by mouth daily. 30 tablet 11   DULoxetine (CYMBALTA) 60 MG capsule Take 60 mg by mouth daily.     famotidine (PEPCID) 20 MG tablet Take 20 mg by mouth as needed.     furosemide (LASIX) 20 MG tablet TAKE 1 TABLET BY MOUTH DAILY 90 tablet 1   levonorgestrel (MIRENA) 20 MCG/24HR IUD 1 each by Intrauterine route once.     LORazepam (ATIVAN) 2 MG tablet Take 2 mg by mouth 2 (two) times daily as needed.      methocarbamol (ROBAXIN) 500 MG tablet As need , not to exceed one a day po 30 tablet 5   nortriptyline (PAMELOR) 25 MG capsule Take 1 capsule (25 mg total) by mouth at  bedtime. 90 capsule 3   ramipril (ALTACE) 5 MG capsule TAKE 1 CAPSULE BY MOUTH DAILY 30 capsule 5   traZODone (DESYREL) 50 MG tablet Take 2 tablets (100 mg total) by mouth at bedtime. 180 tablet 3   valACYclovir (VALTREX) 500 MG tablet Take 500 mg by mouth at bedtime.     zonisamide (ZONEGRAN) 100 MG capsule Take 3 capsules (300 mg total) by mouth at bedtime. 270 capsule 3   No facility-administered medications prior to visit.     PAST MEDICAL HISTORY: Past Medical History:  Diagnosis Date   CHF (congestive heart failure) (HCC)    Complication of anesthesia    Depression    Dilated cardiomyopathy (HCC) dx Feb 2011   02/10/10 echo EF 20%, 11/09/10 echo 45%   GERD (gastroesophageal reflux disease)    Herpes    Migraines    PONV (postoperative nausea and vomiting)    Ureteral stone      PAST SURGICAL HISTORY: Past Surgical History:  Procedure Laterality Date   CARDIAC CATHETERIZATION     CESAREAN SECTION     Cold knife conization     CYSTOSCOPY/RETROGRADE/URETEROSCOPY/STONE  EXTRACTION WITH BASKET Left 12/17/2016   Procedure: CYSTOSCOPY/RETROGRADE/URETEROSCOPY/STONE EXTRACTION WITH BASKET;  Surgeon: Marcine Matar, MD;  Location: [facility];  Service: Urology;  Laterality: Left;   DILATION AND CURETTAGE OF UTERUS     ECTOPIC PREGNANCY SURGERY     Mirena     Inserted 10-2009   Tonsillectomy     TUBAL LIGATION       FAMILY HISTORY: Family History  Problem Relation Age of Onset   Cancer Mother        Ureter   Cancer Father        Colon,Gallbladder   Heart disease Father        Atrial fibrillation   Cancer Brother        Colon     SOCIAL HISTORY: Social History   Socioeconomic History   Marital status: Married    Spouse name: Not on file   Number of children: 2   Years of education: Not on file   Highest education level: Not on file  Occupational History    Employer: [employer]  Tobacco Use   Smoking status: Never   Smokeless tobacco: Never  Vaping Use   Vaping status: Never Used  Substance and Sexual Activity   Alcohol use: Yes    Alcohol/week: 0.0 standard drinks of alcohol    Comment: Occasional   Drug use: No   Sexual activity: Yes    Birth control/protection: I.U.D., Surgical    Comment: Inserted 10-2009  BTL  Other Topics Concern   Not on file  Social History Narrative   She is married and has 2 children.  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   Social Determinants of Health   Financial Resource Strain: Not on file  Food Insecurity: Not on file  Transportation Needs: Not on file  Physical Activity: Not on file  Stress: Not on file  Social Connections: Unknown (02/18/2022)   [facility]   Social Network    Social Network: Not on file  Intimate Partner Violence: Unknown (01/10/2022)   [facility], [facility]   HITS    Physically Hurt: Not on file    Insult or Talk Down To: Not on file    Threaten Physical Harm: Not on file    Scream or  Curse:  Not on file      PHYSICAL EXAM  There were no vitals filed for this visit.   There is no height or weight on file to calculate BMI.   Generalized: Well developed, in no acute distress  Cardiology: normal rate and rhythm, no murmur auscultated  Respiratory: clear to auscultation bilaterally    Neurological examination  Mentation: Alert oriented to time, place, history taking. Follows all commands speech and language fluent Cranial nerve II-XII: Pupils were equal round reactive to light. Extraocular movements were full, visual field were full  Motor: The motor testing reveals 5 over 5 strength of all 4 extremities. Good symmetric motor tone is noted throughout.  Gait and station: Gait is normal.     DIAGNOSTIC DATA (LABS, IMAGING, TESTING) - I reviewed patient records, labs, notes, testing and imaging myself where available.  Lab Results  Component Value Date   WBC 14.7 (H) 12/17/2016   HGB 12.3 12/17/2016   HCT 35.9 (L) 12/17/2016   MCV 87.8 12/17/2016   PLT 173 12/17/2016      Component Value Date/Time   NA 140 09/18/2021 1431   K 4.0 09/18/2021 1431   CL 102 09/18/2021 1431   CO2 22 09/18/2021 1431   GLUCOSE 106 (H) 09/18/2021 1431   GLUCOSE 110 (H) 12/17/2016 1526   BUN 24 09/18/2021 1431   CREATININE 1.22 (H) 09/18/2021 1431   CREATININE 0.79 08/19/2014 1551   CALCIUM 9.6 09/18/2021 1431   PROT 7.1 08/19/2014 1551   ALBUMIN 4.6 08/19/2014 1551   AST 21 08/19/2014 1551   ALT 15 08/19/2014 1551   ALKPHOS 64 08/19/2014 1551   BILITOT 0.4 08/19/2014 1551   GFRNONAA >60 12/17/2016 1526   GFRAA >60 12/17/2016 1526   Lab Results  Component Value Date   CHOL 221 (H) 08/19/2014   HDL 57 08/19/2014   LDLCALC 138 (H) 08/19/2014   TRIG 128 08/19/2014   CHOLHDL 3.9 08/19/2014   No results found for: "HGBA1C" No results found for: "VITAMINB12" Lab Results  Component Value Date   TSH 1.306 08/19/2014      ASSESSMENT AND PLAN  62 y.o. year old  female  has a past medical history of CHF (congestive heart failure) (HCC), Complication of anesthesia, Depression, Dilated cardiomyopathy (HCC) (dx Feb 2011), GERD (gastroesophageal reflux disease), Herpes, Migraines, PONV (postoperative nausea and vomiting), and Ureteral stone. here with   No diagnosis found.  Krystal Bradley is doing very well since starting Ajovy injections every month.  She reports that headaches are easily aborted with butalbital or Robaxin.  We will continue trazodone 100mg  QHS for sleep and nortriptyline 25mg  daily and zonisamide 300mg  daily for migraines preventative therapy.  She will use OTC analgesics for abortive therapy. She has not needed methocarbamol or butalbital. Healthy lifestyle habits encouraged.  Regular follow-up with primary care advised.  She will follow-up with [place] in 1 year, sooner if needed.  She verbalizes understanding and agreement with this plan.  Shawnie Dapper, MSN, FNP-C 09/10/2023, 1:25 PM  [facility] [address], [address] [place], [place] [address] [phone]

## 2023-09-10 NOTE — Patient Instructions (Signed)
Below is our plan:  We will continue trazodone, zonisamide and nortriptyline as prescribed for prevention and methocarbamol for abortive therapy. Please monitor for adverse effects  discussed and let me know if you need me.   Please make sure you are staying well hydrated. I recommend 50-60 ounces daily. Well balanced diet and regular exercise encouraged. Consistent sleep schedule with 6-8 hours recommended.   Please continue follow up with care team as directed.   Follow up with me in 1 year   You may receive a survey regarding today's visit. I encourage you to leave honest feed back as I do use this information to improve patient care. Thank you for seeing me today!   GENERAL HEADACHE INFORMATION:   Natural supplements: Magnesium Oxide or Magnesium Glycinate 500 mg at bed (up to 800 mg daily) Coenzyme Q10 300 mg in AM Vitamin B2- 200 mg twice a day   Add 1 supplement at a time since even natural supplements can have undesirable side effects. You can sometimes buy supplements cheaper (especially Coenzyme Q10) at www.WebmailGuide.co.za or at [facility]  Migraine with aura: There is increased risk for stroke in women with migraine with aura and a contraindication for the combined contraceptive pill for use by women who have migraine with aura. The risk for women with migraine without aura is lower. However other risk factors like smoking are far more likely to increase stroke risk than migraine. There is a recommendation for no smoking and for the use of OCPs without estrogen such as progestogen only pills particularly for women with migraine with aura.Marland Kitchen People who have migraine headaches with auras may be 3 times more likely to have a stroke caused by a blood clot, compared to migraine patients who don't see auras. Women who take hormone-replacement therapy may be 30 percent more likely to suffer a clot-based stroke than women not taking medication containing estrogen. Other risk factors like smoking and  high blood pressure may be  much more important.    Vitamins and herbs that show potential:   Magnesium: Magnesium (250 mg twice a day or 500 mg at bed) has a relaxant effect on smooth muscles such as blood vessels. Individuals suffering from frequent or daily headache usually have low magnesium levels which can be increase with daily supplementation of 400-750 mg. Three trials found 40-90% average headache reduction  when used as a preventative. Magnesium may help with headaches are aura, the best evidence for magnesium is for migraine with aura is its thought to stop the cortical spreading depression we believe is the pathophysiology of migraine aura.Magnesium also demonstrated the benefit in menstrually related migraine.  Magnesium is part of the messenger system in the serotonin cascade and it is a good muscle relaxant.  It is also useful for constipation which can be a side effect of other medications used to treat migraine. Good sources include nuts, whole grains, and tomatoes. Side Effects: loose stool/diarrhea  Riboflavin (vitamin B 2) 200 mg twice a day. This vitamin assists nerve cells in the production of ATP a principal energy storing molecule.  It is necessary for many chemical reactions in the body.  There have been at least 3 clinical trials of riboflavin using 400 mg per day all of which suggested that migraine frequency can be decreased.  All 3 trials showed significant improvement in over half of migraine sufferers.  The supplement is found in bread, cereal, milk, meat, and poultry.  Most Americans get more riboflavin than the recommended daily  allowance, however riboflavin deficiency is not necessary for the supplements to help prevent headache. Side effects: energizing, green urine   Coenzyme Q10: This is present in almost all cells in the body and is critical component for the conversion of energy.  Recent studies have shown that a nutritional supplement of CoQ10 can reduce the frequency  of migraine attacks by improving the energy production of cells as with riboflavin.  Doses of 150 mg twice a day have been shown to be effective.   Melatonin: Increasing evidence shows correlation between melatonin secretion and headache conditions.  Melatonin supplementation has decreased headache intensity and duration.  It is widely used as a sleep aid.  Sleep is natures way of dealing with migraine.  A dose of 3 mg is recommended to start for headaches including cluster headache. Higher doses up to 15 mg has been reviewed for use in Cluster headache and have been used. The rationale behind using melatonin for cluster is that many theories regarding the cause of Cluster headache center around the disruption of the normal circadian rhythm in the brain.  This helps restore the normal circadian rhythm.   HEADACHE DIET: Foods and beverages which may trigger migraine Note that only 20% of headache patients are food sensitive. You will know if you are food sensitive if you get a headache consistently 20 minutes to 2 hours after eating a certain food. Only cut out a food if it causes headaches, otherwise you might remove foods you enjoy! What matters most for diet is to eat a well balanced healthy diet full of vegetables and low fat protein, and to not miss meals.   Chocolate, other sweets ALL cheeses except cottage and cream cheese Dairy products, yogurt, sour cream, ice cream Liver Meat extracts (Bovril, Marmite, meat tenderizers) Meats or fish which have undergone aging, fermenting, pickling or smoking. These include: Hotdogs,salami,Lox,sausage, mortadellas,smoked salmon, pepperoni, Pickled herring Pods of broad bean (English beans, Chinese pea pods, [place] (fava) beans, lima and navy beans Ripe avocado, ripe banana Yeast extracts or active yeast preparations such as Brewer's or Fleishman's (commercial bakes goods are permitted) Tomato based foods, pizza (lasagna, etc.)   MSG (monosodium  glutamate) is disguised as many things; look for these common aliases: Monopotassium glutamate Autolysed yeast Hydrolysed protein Sodium caseinate "flavorings" "all natural preservatives" Nutrasweet   Avoid all other foods that convincingly provoke headaches.   Resources: The Dizzy [place]air Laundry Your Headache Diet, migrainestrong.com  https://zamora-andrews.com/   Caffeine and Migraine For patients that have migraine, caffeine intake more than 3 days per week can lead to dependency and increased migraine frequency. I would recommend cutting back on your caffeine intake as best you can. The recommended amount of caffeine is 200-300 mg daily, although migraine patients may experience dependency at even lower doses. While you may notice an increase in headache temporarily, cutting back will be helpful for headaches in the long run. For more information on caffeine and migraine, visit: https://americanmigrainefoundation.org/resource-library/caffeine-and-migraine/   Headache Prevention Strategies:   1. Maintain a headache diary; learn to identify and avoid triggers.  - This can be a simple note where you log when you had a headache, associated symptoms, and medications used - There are several smartphone apps developed to help track migraines: Migraine Buddy, Migraine Monitor, Curelator N1-Headache App   Common triggers include: Emotional triggers: Emotional/Upset family or friends Emotional/Upset occupation Business reversal/success Anticipation anxiety Crisis-serious Post-crisis periodNew job/position   Physical triggers: Vacation Day Weekend Strenuous Exercise High Altitude Location New Move Menstrual  Day Physical Illness Oversleep/Not enough sleep Weather changes Light: Photophobia or light sesnitivity treatment involves a balance between desensitization and reduction in overly strong input. Use dark polarized glasses outside, but  not inside. Avoid bright or fluorescent light, but do not dim environment to the point that going into a normally lit room hurts. Consider FL-41 tint lenses, which reduce the most irritating wavelengths without blocking too much light.  These can be obtained at axonoptics.com or theraspecs.com Foods: see list above.   2. Limit use of acute treatments (over-the-counter medications, triptans, etc.) to no more than 2 days per week or 10 days per month to prevent medication overuse headache (rebound headache).     3. Follow a regular schedule (including weekends and holidays): Don't skip meals. Eat a balanced diet. 8 hours of sleep nightly. Minimize stress. Exercise 30 minutes per day. Being overweight is associated with a 5 times increased risk of chronic migraine. Keep well hydrated and drink 6-8 glasses of water per day.   4. Initiate non-pharmacologic measures at the earliest onset of your headache. Rest and quiet environment. Relax and reduce stress. Breathe2Relax is a free app that can instruct you on    some simple relaxtion and breathing techniques. Http://Dawnbuse.com is a    free website that provides teaching videos on relaxation.  Also, there are  many apps that   can be downloaded for "mindful" relaxation.  An app called YOGA NIDRA will help walk you through mindfulness. Another app called Calm can be downloaded to give you a structured mindfulness guide with daily reminders and skill development. Headspace for guided meditation Mindfulness Based Stress Reduction Online Course: www.palousemindfulness.com Cold compresses.   5. Don't wait!! Take the maximum allowable dosage of prescribed medication at the first sign of migraine.   6. Compliance:  Take prescribed medication regularly as directed and at the first sign of a migraine.   7. Communicate:  Call your physician when problems arise, especially if your headaches change, increase in frequency/severity, or become associated with  neurological symptoms (weakness, numbness, slurred speech, etc.). Proceed to emergency room if you experience new or worsening symptoms or symptoms do not resolve, if you have new neurologic symptoms or if headache is severe, or for any concerning symptom.   8. Headache/pain management therapies: Consider various complementary methods, including medication, behavioral therapy, psychological counselling, biofeedback, massage therapy, acupuncture, dry needling, and other modalities.  Such measures may reduce the need for medications. Counseling for pain management, where patients learn to function and ignore/minimize their pain, seems to work very well.   9. Recommend changing family's attention and focus away from patient's headaches. Instead, emphasize daily activities. If first question of day is 'How are your headaches/Do you have a headache today?', then patient will constantly think about headaches, thus making them worse. Goal is to re-direct attention away from headaches, toward daily activities and other distractions.   10. Helpful Websites: www.AmericanHeadacheSociety.org PatentHood.ch www.headaches.org TightMarket.nl www.achenet.org

## 2023-09-11 ENCOUNTER — Ambulatory Visit: Payer: 59 | Admitting: Family Medicine

## 2023-09-11 ENCOUNTER — Encounter: Payer: Self-pay | Admitting: Family Medicine

## 2023-09-11 VITALS — BP 124/79 | HR 79 | Ht 64.0 in | Wt 149.5 lb

## 2023-09-11 DIAGNOSIS — F5104 Psychophysiologic insomnia: Secondary | ICD-10-CM

## 2023-09-11 DIAGNOSIS — G43009 Migraine without aura, not intractable, without status migrainosus: Secondary | ICD-10-CM

## 2023-09-11 MED ORDER — ZONISAMIDE 100 MG PO CAPS
300.0000 mg | ORAL_CAPSULE | Freq: Every day | ORAL | 3 refills | Status: DC
  Filled 2024-06-15: qty 270, 90d supply, fill #0
  Filled 2024-08-17: qty 90, 30d supply, fill #0
  Filled 2024-09-10: qty 90, 30d supply, fill #1
  Filled 2024-09-10: qty 90, 30d supply, fill #0

## 2023-09-11 MED ORDER — METHOCARBAMOL 500 MG PO TABS
500.0000 mg | ORAL_TABLET | Freq: Every day | ORAL | 5 refills | Status: DC
  Filled 2024-06-15 – 2024-08-17 (×2): qty 30, 30d supply, fill #0

## 2023-09-11 MED ORDER — TRAZODONE HCL 50 MG PO TABS
100.0000 mg | ORAL_TABLET | Freq: Every day | ORAL | 3 refills | Status: DC
  Filled 2024-08-17: qty 60, 30d supply, fill #0

## 2023-09-11 MED ORDER — NORTRIPTYLINE HCL 25 MG PO CAPS
25.0000 mg | ORAL_CAPSULE | Freq: Every day | ORAL | 3 refills | Status: DC
  Filled 2024-06-15: qty 90, 90d supply, fill #0
  Filled 2024-07-27: qty 30, 30d supply, fill #0
  Filled 2024-08-24: qty 30, 30d supply, fill #1

## 2023-10-24 LAB — LIPID PANEL
Chol/HDL Ratio: 3.1 {ratio} (ref 0.0–4.4)
Cholesterol, Total: 177 mg/dL (ref 100–199)
HDL: 58 mg/dL (ref >39)
LDL Chol Calc (NIH): 95 mg/dL (ref 0–99)
Triglycerides: 140 mg/dL (ref 0–149)
VLDL Cholesterol Cal: 24 mg/dL (ref 5–40)

## 2023-10-24 LAB — HEPATIC FUNCTION PANEL
ALT: 24 [IU]/L (ref 0–32)
AST: 24 [IU]/L (ref 0–40)
Albumin: 4.6 g/dL (ref 3.9–4.9)
Alkaline Phosphatase: 119 [IU]/L (ref 44–121)
Bilirubin Total: 0.4 mg/dL (ref 0.0–1.2)
Bilirubin, Direct: 0.13 mg/dL (ref 0.00–0.40)
Total Protein: 6.8 g/dL (ref 6.0–8.5)

## 2023-10-29 ENCOUNTER — Other Ambulatory Visit: Payer: Self-pay | Admitting: Cardiology

## 2023-11-04 ENCOUNTER — Ambulatory Visit: Payer: 59 | Attending: Nurse Practitioner | Admitting: Emergency Medicine

## 2023-11-04 ENCOUNTER — Encounter: Payer: Self-pay | Admitting: Emergency Medicine

## 2023-11-04 VITALS — BP 116/76 | HR 69 | Ht 64.0 in | Wt 150.4 lb

## 2023-11-04 DIAGNOSIS — I428 Other cardiomyopathies: Secondary | ICD-10-CM

## 2023-11-04 DIAGNOSIS — I5040 Unspecified combined systolic (congestive) and diastolic (congestive) heart failure: Secondary | ICD-10-CM | POA: Diagnosis not present

## 2023-11-04 DIAGNOSIS — E782 Mixed hyperlipidemia: Secondary | ICD-10-CM

## 2023-11-04 NOTE — Patient Instructions (Signed)
Medication Instructions:  Your physician recommends that you continue on your current medications as directed. Please refer to the Current Medication list given to you today.  *If you need a refill on your cardiac medications before your next appointment, please call your pharmacy*   Lab Work: BMET today  Testing/Procedures: Your physician has requested that you have an echocardiogram. Echocardiography is a painless test that uses sound waves to create images of your heart. It provides your doctor with information about the size and shape of your heart and how well your heart's chambers and valves are working. This procedure takes approximately one hour. There are no restrictions for this procedure. Please do NOT wear cologne, perfume, aftershave, or lotions (deodorant is allowed). Please arrive 15 minutes prior to your appointment time.  Please note: We ask at that you not bring children with you during ultrasound (echo/ vascular) testing. Due to room size and safety concerns, children are not allowed in the ultrasound rooms during exams. Our front office staff cannot provide observation of children in our lobby area while testing is being conducted. An adult accompanying a patient to their appointment will only be allowed in the ultrasound room at the discretion of the ultrasound technician under special circumstances. We apologize for any inconvenience.   Follow-Up: At [facility], you and your health needs are our priority.  As part of our continuing mission to provide you with exceptional heart care, we have created designated Provider Care Teams.  These Care Teams include your primary Cardiologist (physician) and Advanced Practice Providers (APPs -  Physician Assistants and Nurse Practitioners) who all work together to provide you with the care you need, when you need it.  We recommend signing up for the patient portal called "MyChart".  Sign up information is provided on this  After Visit Summary.  MyChart is used to connect with patients for Virtual Visits (Telemedicine).  Patients are able to view lab/test results, encounter notes, upcoming appointments, etc.  Non-urgent messages can be sent to your provider as well.   To learn more about what you can do with MyChart, go to [email or web address].    Your next appointment:   1 year(s)  Provider:   Olga Millers, MD     Other Instructions

## 2023-11-04 NOTE — Progress Notes (Signed)
Cardiology Office Note:    Date:  11/04/2023  ID:  VENERA PRIVOTT, DOB [birth year], MRN [id number] PCP: Georgann Housekeeper, MD  [facility] Providers Cardiologist:  Olga Millers, MD       Patient Profile:      Krystal Bradley is a 63 y.o. female with visit-pertinent history of nonischemic cardiomyopathy, chronic combined systolic and diastolic heart failure.  She established with cardiology service in April 2013 for cardiomyopathy and congestive heart failure.  She had echo in February 2011 which showed EF 20-25%, mild biatrial enlargement, mild MR, moderate TR.  Left heart catheterization 2011 showed EF 50% and normal coronary arteries, she was treated medically.  In February 2012 echocardiogram was performed and showed improvement of LV function with an EF of 45%.  Most recent echocardiogram May 2022 showed EF 45-50%, moderate asymmetric LVH of the basal and septal segments, grade 1 DD, trivial MR.    Last seen in clinic on 11/07/2022 she was doing well at the time.  Her LDL was noted to be 148 and Lipitor 20 mg was started.  Echocardiogram was ordered at that time showing LVEF 50-55%, no RWMA, LV hypertrophy of basal-septal segment, grade 1 DD, RV SF normal, aortic valve sclerosis.        History of Present Illness:  Discussed the use of AI scribe software for clinical note transcription with the patient, who gave verbal consent to proceed.  Krystal Bradley is a 63 y.o. female who returns for 1 year follow-up for combined chronic systolic and diastolic heart failure.  Since her last visit, she reports feeling good with no new or worsening symptoms.  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 She denies any issues with their heart, including swelling, weight gain, orthopnea, PND, chest pain, or shortness of breath.  They are currently trying to follow a low-carb diet and decrease their intake of processed foods.     Review of Systems  Constitutional: Negative for weight  gain and weight loss.  Cardiovascular:  Negative for chest pain, claudication, dyspnea on exertion, irregular heartbeat, leg swelling, near-syncope, orthopnea, palpitations, paroxysmal nocturnal dyspnea and syncope.  Respiratory:  Negative for cough, hemoptysis and shortness of breath.   Neurological:  Negative for dizziness and light-headedness.     See HPI     Home Medications:    Prior to Admission medications   Medication Sig Start Date End Date Taking? Authorizing Provider  acetaminophen (TYLENOL) 500 MG tablet Take 500-1,000 mg by mouth every 6 (six) hours as needed for mild pain or moderate pain.    [provider]  atorvastatin (LIPITOR) 20 MG tablet Take 1 tablet (20 mg total) by mouth daily. 03/05/23   Carlos Levering, NP  bifidobacterium infantis (ALIGN) capsule Take by mouth daily.    [provider]  butalbital-acetaminophen-caffeine (FIORICET) 50-325-40 MG tablet Take 1 tablet by mouth every 6 (six) hours as needed for headache. Patient not taking: Reported on 09/11/2023 05/31/20   Dohmeier, Porfirio Mylar, MD  carvedilol (COREG) 12.5 MG tablet TAKE 1 TABLET BY MOUTH TWICE A DAY WITH A MEAL 10/30/23   Lewayne Bunting, MD  dapagliflozin propanediol (FARXIGA) 10 MG TABS tablet Take 1 tablet (10 mg total) by mouth daily. 11/09/22   Lewayne Bunting, MD  DULoxetine (CYMBALTA) 60 MG capsule Take 60 mg by mouth daily.    [provider]  famotidine (PEPCID) 20 MG tablet Take 20 mg by mouth as needed.    [provider]  furosemide (LASIX) 20 MG tablet TAKE 1 TABLET BY MOUTH DAILY 05/21/23   Lewayne Bunting, MD  levonorgestrel (MIRENA) 20 MCG/24HR IUD 1 each by Intrauterine route once.    [provider]  LORazepam (ATIVAN) 2 MG tablet Take 2 mg by mouth 2 (two) times daily as needed.  12/27/19   [provider]  methocarbamol (ROBAXIN) 500 MG tablet As need , not to exceed one a day po 09/11/23   Lomax, Amy, NP  nortriptyline (PAMELOR) 25  MG capsule Take 1 capsule (25 mg total) by mouth at bedtime. 09/11/23   Lomax, Amy, NP  ramipril (ALTACE) 5 MG capsule TAKE 1 CAPSULE BY MOUTH DAILY 05/21/23   Lewayne Bunting, MD  traZODone (DESYREL) 50 MG tablet Take 2 tablets (100 mg total) by mouth at bedtime. 09/11/23   Lomax, Amy, NP  valACYclovir (VALTREX) 500 MG tablet Take 500 mg by mouth at bedtime.    [provider]  zonisamide (ZONEGRAN) 100 MG capsule Take 3 capsules (300 mg total) by mouth at bedtime. 09/11/23   Shawnie Dapper, NP   Studies Reviewed:   EKG Interpretation Date/Time:  Monday November 04 2023 15:10:26 EST Ventricular Rate:  69 PR Interval:  174 QRS Duration:  92 QT Interval:  424 QTC Calculation: 454 R Axis:   19  Text Interpretation: Normal sinus rhythm Nonspecific T wave abnormality Confirmed by Rise Paganini [phone]) on 11/04/2023 3:40:22 PM   Echocardiogram 11/29/2022 1. Low normal to mildly reduced LV systolic function (EF 50).   2. Left ventricular ejection fraction, by estimation, is 50 to 55%. The  left ventricle has low normal function. The left ventricle has no regional  wall motion abnormalities. There is mild left ventricular hypertrophy of  the basal-septal segment. Left  ventricular diastolic parameters are consistent with Grade I diastolic  dysfunction (impaired relaxation).   3. Right ventricular systolic function is normal. The right ventricular  size is normal. Tricuspid regurgitation signal is inadequate for assessing  PA pressure.   4. The mitral valve is normal in structure. No evidence of mitral valve  regurgitation. No evidence of mitral stenosis.   5. The aortic valve is tricuspid. Aortic valve regurgitation is not  visualized. Aortic valve sclerosis is present, with no evidence of aortic  valve stenosis.   6. The inferior vena cava is normal in size with greater than 50%  respiratory variability, suggesting right atrial pressure of 3 mmHg.   Echocardiogram 02/27/2021 1. Mid  and distal septal hypokinesis EF similar to echo done 03/30/20 .  Left ventricular ejection fraction, by estimation, is 45 to 50%. The left  ventricle has mildly decreased function. The left ventricle demonstrates  regional wall motion abnormalities  (see scoring diagram/findings for description). There is moderate  asymmetric left ventricular hypertrophy of the basal and septal segments.  Left ventricular diastolic parameters are consistent with Grade I  diastolic dysfunction (impaired relaxation).   2. Right ventricular systolic function is normal. The right ventricular  size is normal.   3. The mitral valve is abnormal. Trivial mitral valve regurgitation. No  evidence of mitral stenosis.   4. The aortic valve is tricuspid. There is mild calcification of the  aortic valve. Aortic valve regurgitation is not visualized. Mild aortic  valve sclerosis is present, with no evidence of aortic valve stenosis.   5. The inferior vena cava is normal in size with greater than 50%  respiratory variability, suggesting right atrial pressure of 3 mmHg.  Risk Assessment/Calculations:             Physical Exam:   VS:  BP 116/76 (BP Location: Left Arm, Patient Position: Sitting, Cuff Size: Normal)   Pulse 69   Ht 5\' 4"  (1.626 m)   Wt 150 lb 6.4 oz (68.2 kg)   LMP  (LMP Unknown)   SpO2 94%   BMI 25.82 kg/m    Wt Readings from Last 3 Encounters:  11/04/23 150 lb 6.4 oz (68.2 kg)  09/11/23 149 lb 8 oz (67.8 kg)  11/07/22 151 lb (68.5 kg)    Constitutional:      Appearance: Normal and healthy appearance. Not in distress.  HENT:     Head: Normocephalic.  Neck:     Vascular: JVD normal.  Pulmonary:     Effort: Pulmonary effort is normal.     Breath sounds: Normal breath sounds.  Chest:     Chest wall: Not tender to palpatation.  Cardiovascular:     PMI at left midclavicular line. Normal rate. Regular rhythm. Normal S1. Normal S2.      Murmurs: There is no murmur.     No gallop.  No click.  No rub.  Pulses:    Intact distal pulses.  Edema:    Peripheral edema absent.  Musculoskeletal: Normal range of motion.     Cervical back: Normal range of motion and neck supple. Skin:    General: Skin is warm and dry.  Neurological:     General: No focal deficit present.     Mental Status: Alert, oriented to person, place, and time and oriented to person, place and time.  Psychiatric:        Mood and Affect: Mood and affect normal.        Behavior: Behavior is cooperative.        Thought Content: Thought content normal.        Assessment and Plan:  Chronic combined systolic and diastolic heart failure / NICM Echocardiogram 11/2022 with LVEF 50-55%, mild LVH, grade 1 DD LHC in 2011 with normal coronary artery She is euvolemic and well compensated on exam. She is stable with no anginal symptoms -Will get repeat echocardiogram today.  If EF remains stable, will reduce frequency of monitoring and repeat echo as clinically indicated  -Continue Carvedilol 12.5 mg twice daily, Farxiga 10 mg daily, Lasix 20 mg daily, Ramipril 5 mg daily -BMET today  Hyperlipidemia, LDL goal < 100 LDL 10/2023 is 95, and under good control -Continue Atorvastatin 20 mg daily  General Health Maintenance -Discussed heart healthy dieting dieting -Focus on increasing fiber, fruit, vegetables in diet and reducing processed foods -Exercise 150 minutes of moderate intensity weekly           Dispo:  Return in about 1 year (around 11/03/2024).  Signed, Denyce Robert, NP

## 2023-11-05 LAB — BASIC METABOLIC PANEL
BUN/Creatinine Ratio: 20 (ref 12–28)
BUN: 18 mg/dL (ref 8–27)
CO2: 23 mmol/L (ref 20–29)
Calcium: 9.4 mg/dL (ref 8.7–10.3)
Chloride: 103 mmol/L (ref 96–106)
Creatinine, Ser: 0.92 mg/dL (ref 0.57–1.00)
Glucose: 86 mg/dL (ref 70–99)
Potassium: 4 mmol/L (ref 3.5–5.2)
Sodium: 141 mmol/L (ref 134–144)
eGFR: 71 mL/min/{1.73_m2} (ref >59)

## 2023-11-18 ENCOUNTER — Other Ambulatory Visit: Payer: Self-pay

## 2023-11-18 DIAGNOSIS — R601 Generalized edema: Secondary | ICD-10-CM

## 2023-11-18 MED ORDER — FUROSEMIDE 20 MG PO TABS
20.0000 mg | ORAL_TABLET | Freq: Every day | ORAL | 13 refills | Status: DC
  Filled 2024-08-17: qty 30, 30d supply, fill #0
  Filled 2024-10-11: qty 30, 30d supply, fill #1

## 2023-11-23 ENCOUNTER — Other Ambulatory Visit: Payer: Self-pay | Admitting: Cardiology

## 2023-11-25 ENCOUNTER — Other Ambulatory Visit: Payer: Self-pay

## 2023-11-25 MED ORDER — RAMIPRIL 5 MG PO CAPS
5.0000 mg | ORAL_CAPSULE | Freq: Every day | ORAL | 3 refills | Status: AC
  Filled 2024-08-17: qty 30, 30d supply, fill #0
  Filled 2024-10-25: qty 30, 30d supply, fill #1

## 2023-12-09 ENCOUNTER — Ambulatory Visit (HOSPITAL_COMMUNITY)
Admission: RE | Admit: 2023-12-09 | Discharge: 2023-12-09 | Disposition: A | Discharge status: Home / Self Care | Payer: 59 | Source: Ambulatory Visit | Attending: Emergency Medicine | Admitting: Emergency Medicine

## 2023-12-09 DIAGNOSIS — I5040 Unspecified combined systolic (congestive) and diastolic (congestive) heart failure: Secondary | ICD-10-CM | POA: Diagnosis present

## 2023-12-09 LAB — ECHOCARDIOGRAM COMPLETE
AR max vel: 1.89 cm2
AV Area VTI: 1.89 cm2
AV Area mean vel: 1.86 cm2
AV Mean grad: 3 mmHg
AV Peak grad: 5.7 mmHg
Ao pk vel: 1.2 m/s
Area-P 1/2: 3.34 cm2
MV M vel: 1.43 m/s
MV Peak grad: 8.2 mmHg
S' Lateral: 2.8 cm

## 2024-01-28 ENCOUNTER — Other Ambulatory Visit: Payer: Self-pay

## 2024-01-28 MED ORDER — CARVEDILOL 12.5 MG PO TABS
12.5000 mg | ORAL_TABLET | Freq: Two times a day (BID) | ORAL | 2 refills | Status: AC
  Filled 2024-06-15: qty 180, 90d supply, fill #0
  Filled 2024-07-27: qty 60, 30d supply, fill #0
  Filled 2024-08-24: qty 60, 30d supply, fill #1
  Filled 2024-10-25: qty 60, 30d supply, fill #2

## 2024-02-26 ENCOUNTER — Other Ambulatory Visit: Payer: Self-pay | Admitting: Student

## 2024-06-12 ENCOUNTER — Telehealth: Payer: Self-pay | Admitting: Pharmacy Technician

## 2024-06-12 ENCOUNTER — Other Ambulatory Visit (HOSPITAL_COMMUNITY): Payer: Self-pay

## 2024-06-12 NOTE — Telephone Encounter (Signed)
 Pharmacy Patient Advocate Encounter   Received notification from CoverMyMeds that prior authorization for Farxiga  is required/requested.   Insurance verification completed.   The patient is insured through [employer] .   Per test claim: PA required; PA submitted to above mentioned insurance via Latent Key/confirmation #/EOC [id number] Status is pending

## 2024-06-12 NOTE — Telephone Encounter (Signed)
 Pharmacy Patient Advocate Encounter  Received notification from [facility] [employer] that Prior Authorization for Farxiga  Krystal Bradley has been APPROVED from 06/12/24 to 06/12/25. Ran test claim, Copay is $15.00- one month. This test claim was processed through [facility]- copay amounts may vary at other pharmacies due to pharmacy/plan contracts, or as the patient moves through the different stages of their insurance plan.   PA #/Case ID/Reference #: [id number]

## 2024-06-15 ENCOUNTER — Other Ambulatory Visit: Payer: Self-pay | Admitting: *Deleted

## 2024-06-15 ENCOUNTER — Other Ambulatory Visit (HOSPITAL_COMMUNITY): Payer: Self-pay

## 2024-06-15 ENCOUNTER — Encounter: Payer: Self-pay | Admitting: Cardiology

## 2024-06-15 MED ORDER — DAPAGLIFLOZIN PROPANEDIOL 5 MG PO TABS: 10.0000 mg | ORAL_TABLET | Freq: Every day | ORAL | 0 refills | Status: AC

## 2024-06-15 MED ORDER — CEQUA 0.09 % OP SOLN: 1.0000 [drp] | Freq: Two times a day (BID) | OPHTHALMIC | 3 refills | Status: AC

## 2024-06-15 MED ORDER — CEQUA 0.09 % OP SOLN: 1.0000 [drp] | Freq: Two times a day (BID) | OPHTHALMIC | 2 refills | Status: AC

## 2024-06-15 MED ORDER — VALACYCLOVIR HCL 500 MG PO TABS: 500.0000 mg | ORAL_TABLET | Freq: Every day | ORAL | 5 refills | Status: DC

## 2024-07-06 ENCOUNTER — Other Ambulatory Visit: Payer: Self-pay | Admitting: Medical Genetics

## 2024-07-06 DIAGNOSIS — E785 Hyperlipidemia, unspecified: Secondary | ICD-10-CM | POA: Diagnosis not present

## 2024-07-06 DIAGNOSIS — N182 Chronic kidney disease, stage 2 (mild): Secondary | ICD-10-CM | POA: Diagnosis not present

## 2024-07-06 DIAGNOSIS — I509 Heart failure, unspecified: Secondary | ICD-10-CM | POA: Diagnosis not present

## 2024-07-06 DIAGNOSIS — Z0000 Encounter for general adult medical examination without abnormal findings: Secondary | ICD-10-CM | POA: Diagnosis not present

## 2024-07-06 DIAGNOSIS — F331 Major depressive disorder, recurrent, moderate: Secondary | ICD-10-CM | POA: Diagnosis not present

## 2024-07-27 ENCOUNTER — Other Ambulatory Visit: Payer: Self-pay

## 2024-07-27 ENCOUNTER — Other Ambulatory Visit (HOSPITAL_COMMUNITY): Payer: Self-pay

## 2024-08-17 ENCOUNTER — Other Ambulatory Visit (HOSPITAL_COMMUNITY): Payer: Self-pay

## 2024-08-17 ENCOUNTER — Other Ambulatory Visit: Payer: Self-pay

## 2024-08-17 MED FILL — Atorvastatin Calcium Tab 20 MG (Base Equivalent): ORAL | 30 days supply | Qty: 30 | Fill #0 | Status: AC

## 2024-08-18 ENCOUNTER — Encounter (HOSPITAL_COMMUNITY): Payer: Self-pay

## 2024-08-18 ENCOUNTER — Other Ambulatory Visit (HOSPITAL_COMMUNITY): Payer: Self-pay

## 2024-08-24 ENCOUNTER — Other Ambulatory Visit (HOSPITAL_COMMUNITY): Payer: Self-pay

## 2024-09-09 NOTE — Progress Notes (Unsigned)
 No chief complaint on file.   HISTORY OF PRESENT ILLNESS:  09/10/2024 ALL: Krystal Bradley returns for follow up for migraines and insomnia. She continues trazodone  100mg  QHS for sleep and nortriptyline  25mg  daily and zonisamide  300mg  daily for migraine preventative therapy. Butalbital  and baclofen used for abortive therapy.   09/20/2023 ALL: Krystal Bradley returns for follow up for migraines and insomnia. She was last seen 09/2022 and doing well. Since, She continues trazodone  100mg  QHS, nortriptyline  25mg  QHS and zonisamide  300mg  daily. Butalbital  and methocarbamol  previously used for abortive therapy but not used, recently. She continues to do well. Rare headaches. Occasional tension style headaches at base of neck. Methocarbamol  helps. May use a couple per year.   09/19/2022 ALL:  Krystal Bradley returns for follow up for migraines and insomnia. She continues trazodone  100mg  QHS, nortriptyline  25mg  QHS and zonisamide  300mg  daily. She reports doing well. Headaches are very well managed. She can't remember the last bad headache she had. She is sleeping well. She is tolerating medications well with no obvious adverse effects.   09/13/2021 ALL: Krystal Bradley returns for follow up for migraines and insomnia. She has continued trazodone  100mg  QHS, nortriptyline  25mg  QHS and zonisamide  300mg  daily. She did not continue Ajovy  due to whelping of skin with injections. She reports doing well. She may have a couple of mild headaches every month. She is able to take OTC analgesics for abortive therapy. She is sleeping well. She is happy with current regimen.   09/07/2020 ALL:  Krystal Bradley is a 63 y.o. female here today for follow up for migraines. She was started on Ajovy  at last visit with Dr Chalice. She also continues nortriptyline  and zonisamide . Butalbital  used sparingly for abortive therapy. She feels Ajovy  has helped. She may have 4-5 headache days per month. She can not remember the last time she had a migraine. She uses  butalbital  2-3 times a month but does not know that they were full migraines. She occasionally uses robaxin  for tension headaches. She is feeling well today and without complaints.   HISTORY (copied from previous note)  Krystal Bradley is a 63 y.o. year old- Caucasian female patient is seen here for transfer of Migraine care on 05/31/2020.    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  She has for many years followed Huston Schiller as her headache specialist. She has had migraines for at least 35 years.  Used to be related to menstrual cycle.  Dr. Schiller has moved to [place] , she is looking for continuation of her care.  We have the last visit was not been taking available here which stated that the patient was given Ubrelvy samples.  That she cannot have triptans because of a history of a post viral cardiomyopathy and CHF.  She retained fluid in 2011 and learnt to her surprise that she had CHF- She is followed by Dr. Pietro at [facility] heart.  She has regular echocardiograms which she states have been normal.  He does have a history of hearing loss headaches insomnia anxiety and the feeling that she does not get enough sleep..  I would like to add that the drug dependent or unrelieved medication did cause her to feel nauseated but she did not get relief in terms of headaches.  She has never been on any of the injectable medications CRP such as Ajovy , Emgality, Aimovig .   I have the pleasure of seeing Krystal Ladson  Bradley today, a right -handed White or Caucasian female with a headache and sleep disorder.  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   The patient never had a sleep study.    Sleep relevant medical history: Insonmia.  Family medical /sleep history:NO  other family member with OSA, insomnia, sleep walkers. mother died when she was young, father had atrial fibrillation, died of pancreatic cancer.   Social history: Patient is working as a surveyor, mining  and lives in a household with her spouse,- with adult children.  The patient currently works in office, day time. Pets are present. One cat.  Tobacco use- never. ETOH use:  Beer 1-2 / weekly , Caffeine  intake in form of Coffee( 1 cup in AM) Soda( /) Tea ( / ) nor energy drinks.Regular exercise: none   Hobbies :DIY projects.    Sleep habits are as follows: The patient's dinner time is between 7 PM. The patient goes to bed at 10 PM and takes AMBIEN every night -continues to sleep for 6-7 hours, wakes rarely for bathroom breaks.   The preferred sleep position is laterally, with the support of one pillow. Dreams are reportedly frequent/vivid.  5.30 AM is the usual rise time. The patient wakes up with an alarm.  She reports  feeling refreshed - restored in AM, with symptoms such as dry mouth ,some morning headaches , dull- not nauseated. and residual fatigue.  Naps are taken infrequently.  REVIEW OF SYSTEMS: Out of a complete 14 system review of symptoms, the patient complains only of the following symptoms, headaches, anxiety, neck pain and all other reviewed systems are negative.   ALLERGIES: Allergies  Allergen Reactions   Ibuprofen Hives   Penicillins Hives and Other (See Comments)    Has patient had a PCN reaction causing immediate rash, facial/tongue/throat swelling, SOB or lightheadedness with hypotension: No Has patient had a PCN reaction causing severe rash involving mucus membranes or skin necrosis: No Has patient had a PCN reaction that required hospitalization No Has patient had a PCN reaction occurring within the last 10 years: No If all of the above answers are NO, then may proceed with  Cephalosporin use.   Cephalosporins Hives     HOME MEDICATIONS: Outpatient Medications Prior to Visit  Medication Sig Dispense Refill   acetaminophen  (TYLENOL ) 500 MG tablet Take 500-1,000 mg by mouth every 6 (six) hours as needed for mild pain or moderate pain.     atorvastatin  (LIPITOR) 20 MG tablet Take 1 tablet (20 mg total) by mouth daily. 90 tablet 2   bifidobacterium infantis (ALIGN) capsule Take by mouth daily.     butalbital -acetaminophen -caffeine  (FIORICET) 50-325-40 MG tablet Take 1 tablet by mouth every 6 (six) hours as needed for headache. 14 tablet 2   carvedilol  (COREG ) 12.5 MG tablet Take 1 tablet (12.5 mg total) by mouth 2 (two) times daily with a meal. 180 tablet 2   CEQUA  0.09 % SOLN Apply 1 drop to eye 2 (two) times daily.     cycloSPORINE , PF, (CEQUA ) 0.09 % SOLN Place 1 drop into both eyes 2 (two) times daily. 180 each 3   cycloSPORINE , PF, (CEQUA ) 0.09 % SOLN Place 1 drop into both eyes every 12 (twelve) hours. 60 each 2   dapagliflozin  propanediol (FARXIGA ) 10 MG TABS tablet TAKE 1 TABLET BY  MOUTH DAILY 90 tablet 3   dapagliflozin  propanediol (FARXIGA ) 5 MG TABS tablet Take 2 tablets (10 mg total) by mouth daily. 14 tablet 0   DULoxetine (CYMBALTA) 60 MG capsule Take 60 mg by mouth daily.     famotidine (PEPCID) 20 MG tablet Take 20 mg by mouth as needed.     furosemide  (LASIX ) 20 MG tablet Take 1 tablet (20 mg total) by mouth daily. 90 tablet 13   LORazepam  (ATIVAN ) 2 MG tablet Take 2 mg by mouth 2 (two) times daily as needed.      methocarbamol  (ROBAXIN ) 500 MG tablet Take 1 tablet (500 mg total) by mouth daily as needed. Do not exceed one tablet per 24 hours. 30 tablet 5   nortriptyline  (PAMELOR ) 25 MG capsule Take 1 capsule (25 mg total) by mouth at bedtime. 90 capsule 3   ramipril  (ALTACE ) 5 MG capsule Take 1 capsule (5 mg total) by mouth daily. 90 capsule 3   traZODone  (DESYREL ) 50 MG tablet Take 2 tablets (100 mg total) by mouth at bedtime. 180 tablet 3    valACYclovir  (VALTREX ) 500 MG tablet Take 500 mg by mouth at bedtime.     valACYclovir  (VALTREX ) 500 MG tablet Take 1 tablet (500 mg total) by mouth daily. 90 tablet 5   zonisamide  (ZONEGRAN ) 100 MG capsule Take 3 capsules (300 mg total) by mouth at bedtime. 270 capsule 3   No facility-administered medications prior to visit.     PAST MEDICAL HISTORY: Past Medical History:  Diagnosis Date   CHF (congestive heart failure) (HCC)    Complication of anesthesia    Depression    Dilated cardiomyopathy (HCC) dx Feb 2011   02/10/10 echo EF 20%, 11/09/10 echo 45%   GERD (gastroesophageal reflux disease)    Herpes    Migraines    PONV (postoperative nausea and vomiting)    Ureteral stone      PAST SURGICAL HISTORY: Past Surgical History:  Procedure Laterality Date   CARDIAC CATHETERIZATION     CESAREAN SECTION     Cold knife conization     CYSTOSCOPY/RETROGRADE/URETEROSCOPY/STONE EXTRACTION WITH BASKET Left 12/17/2016   Procedure: CYSTOSCOPY/RETROGRADE/URETEROSCOPY/STONE EXTRACTION WITH BASKET;  Surgeon: Garnette Shack, MD;  Location: [facility];  Service: Urology;  Laterality: Left;   DILATION AND CURETTAGE OF UTERUS     ECTOPIC PREGNANCY SURGERY     Mirena     Inserted 10-2009   Tonsillectomy     TUBAL LIGATION       FAMILY HISTORY: Family History  Problem Relation Age of Onset   Cancer Mother        Ureter   Cancer Father        Colon,Gallbladder   Heart disease Father        Atrial fibrillation   Cancer Brother        Colon     SOCIAL HISTORY: Social History   Socioeconomic History   Marital status: Married    Spouse name: Not on file   Number of children: 2   Years of education: Not on file   Highest education level: Not on file  Occupational History    Employer: [employer]  Tobacco Use   Smoking status: Never   Smokeless tobacco: Never  Vaping Use   Vaping status: Never Used  Substance and Sexual Activity   Alcohol  use: Yes     Alcohol /week: 0.0 standard drinks of alcohol     Comment: Occasional   Drug use: No  Sexual activity: Yes    Birth control/protection: I.U.D., Surgical    Comment: Inserted 10-2009  BTL  Other Topics Concern   Not on file  Social History Narrative   She is married and has 2 children.  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   Social Drivers of Corporate Investment Banker Strain: Not on file  Food Insecurity: Not on file  Transportation Needs: Not on file  Physical Activity: Not on file  Stress: Not on file  Social Connections: Unknown (02/18/2022)   Received from [facility]   Social Network    Social Network: Not on file  Intimate Partner Violence: Unknown (01/10/2022)   Received from [facility]   HITS    Physically Hurt: Not on file    Insult or Talk Down To: Not on file    Threaten Physical Harm: Not on file    Scream or Curse: Not on file      PHYSICAL EXAM  There were no vitals filed for this visit.    There is no height or weight on file to calculate BMI.   Generalized: Well developed, in no acute distress  Cardiology: normal rate and rhythm, no murmur auscultated  Respiratory: clear to auscultation bilaterally    Neurological examination  Mentation: Alert oriented to time, place, history taking. Follows all commands speech and language fluent Cranial nerve II-XII: Pupils were equal round reactive to light. Extraocular movements were full, visual field were full  Motor: The motor testing reveals 5 over 5 strength of all 4 extremities. Good symmetric motor tone is noted throughout.  Gait and station: Gait is normal.     DIAGNOSTIC DATA (LABS, IMAGING, TESTING) - I reviewed patient records, labs, notes, testing and imaging myself where available.  Lab Results  Component Value Date   WBC 14.7 (H) 12/17/2016   HGB 12.3 12/17/2016   HCT 35.9 (L) 12/17/2016   MCV 87.8 12/17/2016   PLT 173 12/17/2016      Component Value Date/Time    NA 141 11/04/2023 1559   K 4.0 11/04/2023 1559   CL 103 11/04/2023 1559   CO2 23 11/04/2023 1559   GLUCOSE 86 11/04/2023 1559   GLUCOSE 110 (H) 12/17/2016 1526   BUN 18 11/04/2023 1559   CREATININE 0.92 11/04/2023 1559   CREATININE 0.79 08/19/2014 1551   CALCIUM  9.4 11/04/2023 1559   PROT 6.8 10/24/2023 0825   ALBUMIN 4.6 10/24/2023 0825   AST 24 10/24/2023 0825   ALT 24 10/24/2023 0825   ALKPHOS 119 10/24/2023 0825   BILITOT 0.4 10/24/2023 0825   GFRNONAA >60 12/17/2016 1526   GFRAA >60 12/17/2016 1526   Lab Results  Component Value Date   CHOL 177 10/24/2023   HDL 58 10/24/2023   LDLCALC 95 10/24/2023   TRIG 140 10/24/2023   CHOLHDL 3.1 10/24/2023   No results found for: HGBA1C No results found for: VITAMINB12 Lab Results  Component Value Date   TSH 1.306 08/19/2014      ASSESSMENT AND PLAN  63 y.o. year old female  has a past medical history of CHF (congestive heart failure) (HCC), Complication of anesthesia, Depression, Dilated cardiomyopathy (HCC) (dx Feb 2011), GERD (gastroesophageal reflux disease), Herpes, Migraines, PONV (postoperative nausea and vomiting), and Ureteral stone. here with   No diagnosis found.  Rebecca is doing very well. She reports that headaches are easily aborted with butalbital  or Robaxin .  We will continue trazodone  100mg  QHS for sleep and nortriptyline  25mg   daily and zonisamide  300mg  daily for migraine preventative therapy.  She will use OTC analgesics for abortive therapy. She may use methocarbamol  PRN. She has not needed butalbital . Healthy lifestyle habits encouraged.  Regular follow-up with primary care advised.  She will follow-up with us  in 1 year, sooner if needed.  She verbalizes understanding and agreement with this plan.  Greig Forbes, MSN, FNP-C 09/09/2024, 9:02 AM  [facility] [address] [place], [place] [address] [phone]

## 2024-09-09 NOTE — Patient Instructions (Signed)
Below is our plan:  We will continue current treatment plan  Please make sure you are staying well hydrated. I recommend 50-60 ounces daily. Well balanced diet and regular exercise encouraged. Consistent sleep schedule with 6-8 hours recommended.   Please continue follow up with care team as directed.   Follow up with me in 1 year   You may receive a survey regarding today's visit. I encourage you to leave honest feed back as I do use this information to improve patient care. Thank you for seeing me today!  GENERAL HEADACHE INFORMATION:   Natural supplements: Magnesium Oxide or Magnesium Glycinate 500 mg at bed (up to 800 mg daily) Coenzyme Q10 300 mg in AM Vitamin B2- 200 mg twice a day   Add 1 supplement at a time since even natural supplements can have undesirable side effects. You can sometimes buy supplements cheaper (especially Coenzyme Q10) at www.WebmailGuide.co.za or at [facility].  Migraine with aura: There is increased risk for stroke in women with migraine with aura and a contraindication for the combined contraceptive pill for use by women who have migraine with aura. The risk for women with migraine without aura is lower. However other risk factors like smoking are far more likely to increase stroke risk than migraine. There is a recommendation for no smoking and for the use of OCPs without estrogen such as progestogen only pills particularly for women with migraine with aura.Marland Kitchen People who have migraine headaches with auras may be 3 times more likely to have a stroke caused by a blood clot, compared to migraine patients who don't see auras. Women who take hormone-replacement therapy may be 30 percent more likely to suffer a clot-based stroke than women not taking medication containing estrogen. Other risk factors like smoking and high blood pressure may be  much more important.    Vitamins and herbs that show potential:   Magnesium: Magnesium (250 mg twice a day or 500 mg at bed) has a  relaxant effect on smooth muscles such as blood vessels. Individuals suffering from frequent or daily headache usually have low magnesium levels which can be increase with daily supplementation of 400-750 mg. Three trials found 40-90% average headache reduction  when used as a preventative. Magnesium may help with headaches are aura, the best evidence for magnesium is for migraine with aura is its thought to stop the cortical spreading depression we believe is the pathophysiology of migraine aura.Magnesium also demonstrated the benefit in menstrually related migraine.  Magnesium is part of the messenger system in the serotonin cascade and it is a good muscle relaxant.  It is also useful for constipation which can be a side effect of other medications used to treat migraine. Good sources include nuts, whole grains, and tomatoes. Side Effects: loose stool/diarrhea  Riboflavin (vitamin B 2) 200 mg twice a day. This vitamin assists nerve cells in the production of ATP a principal energy storing molecule.  It is necessary for many chemical reactions in the body.  There have been at least 3 clinical trials of riboflavin using 400 mg per day all of which suggested that migraine frequency can be decreased.  All 3 trials showed significant improvement in over half of migraine sufferers.  The supplement is found in bread, cereal, milk, meat, and poultry.  Most Americans get more riboflavin than the recommended daily allowance, however riboflavin deficiency is not necessary for the supplements to help prevent headache. Side effects: energizing, green urine   Coenzyme Q10: This is present in  almost all cells in the body and is critical component for the conversion of energy.  Recent studies have shown that a nutritional supplement of CoQ10 can reduce the frequency of migraine attacks by improving the energy production of cells as with riboflavin.  Doses of 150 mg twice a day have been shown to be effective.   Melatonin:  Increasing evidence shows correlation between melatonin secretion and headache conditions.  Melatonin supplementation has decreased headache intensity and duration.  It is widely used as a sleep aid.  Sleep is natures way of dealing with migraine.  A dose of 3 mg is recommended to start for headaches including cluster headache. Higher doses up to 15 mg has been reviewed for use in Cluster headache and have been used. The rationale behind using melatonin for cluster is that many theories regarding the cause of Cluster headache center around the disruption of the normal circadian rhythm in the brain.  This helps restore the normal circadian rhythm.   HEADACHE DIET: Foods and beverages which may trigger migraine Note that only 20% of headache patients are food sensitive. You will know if you are food sensitive if you get a headache consistently 20 minutes to 2 hours after eating a certain food. Only cut out a food if it causes headaches, otherwise you might remove foods you enjoy! What matters most for diet is to eat a well balanced healthy diet full of vegetables and low fat protein, and to not miss meals.   Chocolate, other sweets ALL cheeses except cottage and cream cheese Dairy products, yogurt, sour cream, ice cream Liver Meat extracts (Bovril, Marmite, meat tenderizers) Meats or fish which have undergone aging, fermenting, pickling or smoking. These include: Hotdogs,salami,Lox,sausage, mortadellas,smoked salmon, pepperoni, Pickled herring Pods of broad bean (English beans, Chinese pea pods, [place] (fava) beans, lima and navy beans Ripe avocado, ripe banana Yeast extracts or active yeast preparations such as Brewer's or Fleishman's (commercial bakes goods are permitted) Tomato based foods, pizza (lasagna, etc.)   MSG (monosodium glutamate) is disguised as many things; look for these common aliases: Monopotassium glutamate Autolysed yeast Hydrolysed protein Sodium  caseinate "flavorings" "all natural preservatives" Nutrasweet   Avoid all other foods that convincingly provoke headaches.   Resources: The Dizzy [place] Laundry Your Headache Diet, migrainestrong.com  https://zamora-andrews.com/   Caffeine and Migraine For patients that have migraine, caffeine intake more than 3 days per week can lead to dependency and increased migraine frequency. I would recommend cutting back on your caffeine intake as best you can. The recommended amount of caffeine is 200-300 mg daily, although migraine patients may experience dependency at even lower doses. While you may notice an increase in headache temporarily, cutting back will be helpful for headaches in the long run. For more information on caffeine and migraine, visit: https://americanmigrainefoundation.org/resource-library/caffeine-and-migraine/   Headache Prevention Strategies:   1. Maintain a headache diary; learn to identify and avoid triggers.  - This can be a simple note where you log when you had a headache, associated symptoms, and medications used - There are several smartphone apps developed to help track migraines: Migraine Buddy, Migraine Monitor, Curelator N1-Headache App   Common triggers include: Emotional triggers: Emotional/Upset family or friends Emotional/Upset occupation Business reversal/success Anticipation anxiety Crisis-serious Post-crisis periodNew job/position   Physical triggers: Vacation Day Weekend Strenuous Exercise High Altitude Location New Move Menstrual Day Physical Illness Oversleep/Not enough sleep Weather changes Light: Photophobia or light sesnitivity treatment involves a balance between desensitization and reduction in overly strong input. Use dark  polarized glasses outside, but not inside. Avoid bright or fluorescent light, but do not dim environment to the point that going into a normally lit room hurts. Consider  FL-41 tint lenses, which reduce the most irritating wavelengths without blocking too much light.  These can be obtained at axonoptics.com or theraspecs.com Foods: see list above.   2. Limit use of acute treatments (over-the-counter medications, triptans, etc.) to no more than 2 days per week or 10 days per month to prevent medication overuse headache (rebound headache).     3. Follow a regular schedule (including weekends and holidays): Don't skip meals. Eat a balanced diet. 8 hours of sleep nightly. Minimize stress. Exercise 30 minutes per day. Being overweight is associated with a 5 times increased risk of chronic migraine. Keep well hydrated and drink 6-8 glasses of water per day.   4. Initiate non-pharmacologic measures at the earliest onset of your headache. Rest and quiet environment. Relax and reduce stress. Breathe2Relax is a free app that can instruct you on    some simple relaxtion and breathing techniques. Http://Dawnbuse.com is a    free website that provides teaching videos on relaxation.  Also, there are  many apps that   can be downloaded for "mindful" relaxation.  An app called YOGA NIDRA will help walk you through mindfulness. Another app called Calm can be downloaded to give you a structured mindfulness guide with daily reminders and skill development. Headspace for guided meditation Mindfulness Based Stress Reduction Online Course: www.palousemindfulness.com Cold compresses.   5. Don't wait!! Take the maximum allowable dosage of prescribed medication at the first sign of migraine.   6. Compliance:  Take prescribed medication regularly as directed and at the first sign of a migraine.   7. Communicate:  Call your physician when problems arise, especially if your headaches change, increase in frequency/severity, or become associated with neurological symptoms (weakness, numbness, slurred speech, etc.). Proceed to emergency room if you experience new or worsening symptoms or  symptoms do not resolve, if you have new neurologic symptoms or if headache is severe, or for any concerning symptom.   8. Headache/pain management therapies: Consider various complementary methods, including medication, behavioral therapy, psychological counselling, biofeedback, massage therapy, acupuncture, dry needling, and other modalities.  Such measures may reduce the need for medications. Counseling for pain management, where patients learn to function and ignore/minimize their pain, seems to work very well.   9. Recommend changing family's attention and focus away from patient's headaches. Instead, emphasize daily activities. If first question of day is 'How are your headaches/Do you have a headache today?', then patient will constantly think about headaches, thus making them worse. Goal is to re-direct attention away from headaches, toward daily activities and other distractions.   10. Helpful Websites: www.AmericanHeadacheSociety.org PatentHood.ch www.headaches.org TightMarket.nl www.achenet.org

## 2024-09-10 ENCOUNTER — Other Ambulatory Visit (HOSPITAL_COMMUNITY): Payer: Self-pay

## 2024-09-10 ENCOUNTER — Encounter: Payer: Self-pay | Admitting: Family Medicine

## 2024-09-10 ENCOUNTER — Ambulatory Visit: Payer: 59 | Admitting: Family Medicine

## 2024-09-10 VITALS — BP 109/73 | HR 81 | Ht 64.0 in | Wt 155.0 lb

## 2024-09-10 DIAGNOSIS — G43009 Migraine without aura, not intractable, without status migrainosus: Secondary | ICD-10-CM | POA: Diagnosis not present

## 2024-09-10 DIAGNOSIS — F5104 Psychophysiologic insomnia: Secondary | ICD-10-CM | POA: Diagnosis not present

## 2024-09-10 MED ORDER — ZONISAMIDE 100 MG PO CAPS
300.0000 mg | ORAL_CAPSULE | Freq: Every day | ORAL | 3 refills | Status: AC
  Filled 2024-09-10: qty 270, 90d supply, fill #0
  Filled 2024-10-11: qty 90, 30d supply, fill #0
  Filled 2024-11-02: qty 90, 30d supply, fill #1
  Filled 2024-11-13: qty 90, 30d supply, fill #2

## 2024-09-10 MED ORDER — METHOCARBAMOL 500 MG PO TABS
500.0000 mg | ORAL_TABLET | Freq: Every day | ORAL | 5 refills | Status: AC
  Filled 2024-09-10: qty 30, 30d supply, fill #0

## 2024-09-10 MED ORDER — TRAZODONE HCL 50 MG PO TABS
100.0000 mg | ORAL_TABLET | Freq: Every day | ORAL | 3 refills | Status: AC
  Filled 2024-09-10: qty 60, 30d supply, fill #0
  Filled 2024-11-02: qty 60, 30d supply, fill #1

## 2024-09-10 MED ORDER — NORTRIPTYLINE HCL 25 MG PO CAPS
25.0000 mg | ORAL_CAPSULE | Freq: Every day | ORAL | 3 refills | Status: AC
  Filled 2024-09-10: qty 90, 90d supply, fill #0
  Filled 2024-09-15: qty 30, 30d supply, fill #0
  Filled 2024-10-25: qty 30, 30d supply, fill #1
  Filled 2024-11-13: qty 30, 30d supply, fill #2

## 2024-09-11 ENCOUNTER — Other Ambulatory Visit (HOSPITAL_COMMUNITY): Payer: Self-pay

## 2024-09-11 ENCOUNTER — Other Ambulatory Visit: Payer: Self-pay

## 2024-09-15 ENCOUNTER — Other Ambulatory Visit (HOSPITAL_COMMUNITY): Payer: Self-pay

## 2024-09-15 ENCOUNTER — Other Ambulatory Visit: Payer: Self-pay

## 2024-10-06 ENCOUNTER — Other Ambulatory Visit (HOSPITAL_COMMUNITY): Payer: Self-pay

## 2024-10-06 MED FILL — Atorvastatin Calcium Tab 20 MG (Base Equivalent): ORAL | 30 days supply | Qty: 30 | Fill #1 | Status: CN

## 2024-10-06 MED FILL — Atorvastatin Calcium Tab 20 MG (Base Equivalent): ORAL | 30 days supply | Qty: 30 | Fill #0 | Status: AC

## 2024-10-11 ENCOUNTER — Other Ambulatory Visit (HOSPITAL_COMMUNITY): Payer: Self-pay

## 2024-10-12 ENCOUNTER — Other Ambulatory Visit (HOSPITAL_COMMUNITY): Payer: Self-pay

## 2024-10-15 ENCOUNTER — Encounter: Payer: Self-pay | Admitting: Cardiology

## 2024-10-15 DIAGNOSIS — R601 Generalized edema: Secondary | ICD-10-CM

## 2024-10-16 ENCOUNTER — Other Ambulatory Visit: Payer: Self-pay

## 2024-10-16 ENCOUNTER — Other Ambulatory Visit (HOSPITAL_COMMUNITY): Payer: Self-pay

## 2024-10-16 MED ORDER — FUROSEMIDE 20 MG PO TABS
20.0000 mg | ORAL_TABLET | Freq: Every day | ORAL | 13 refills | Status: AC
  Filled 2024-10-16: qty 90, 90d supply, fill #0
  Filled 2024-10-19: qty 30, 30d supply, fill #0
  Filled 2024-11-02 – 2024-11-13 (×2): qty 30, 30d supply, fill #1

## 2024-10-19 ENCOUNTER — Other Ambulatory Visit: Payer: Self-pay

## 2024-10-19 ENCOUNTER — Other Ambulatory Visit (HOSPITAL_COMMUNITY): Payer: Self-pay

## 2024-10-26 ENCOUNTER — Other Ambulatory Visit: Payer: Self-pay

## 2024-11-02 ENCOUNTER — Other Ambulatory Visit (HOSPITAL_COMMUNITY): Payer: Self-pay

## 2024-11-02 MED FILL — Atorvastatin Calcium Tab 20 MG (Base Equivalent): ORAL | 30 days supply | Qty: 30 | Fill #1 | Status: AC

## 2024-11-03 ENCOUNTER — Other Ambulatory Visit (HOSPITAL_COMMUNITY): Payer: Self-pay

## 2024-11-03 MED ORDER — VALACYCLOVIR HCL 500 MG PO TABS
500.0000 mg | ORAL_TABLET | Freq: Every day | ORAL | 5 refills | Status: AC
  Filled 2024-11-03: qty 90, 90d supply, fill #0
  Filled ????-??-??: fill #0

## 2024-11-04 ENCOUNTER — Other Ambulatory Visit: Payer: Self-pay

## 2024-11-13 ENCOUNTER — Other Ambulatory Visit (HOSPITAL_COMMUNITY): Payer: Self-pay

## 2024-11-13 ENCOUNTER — Other Ambulatory Visit: Payer: Self-pay | Admitting: Cardiology

## 2024-11-13 ENCOUNTER — Other Ambulatory Visit: Payer: Self-pay

## 2024-11-26 ENCOUNTER — Ambulatory Visit: Payer: Self-pay | Admitting: Cardiology

## 2025-09-28 ENCOUNTER — Ambulatory Visit: Admitting: Family Medicine
